# Patient Record
Sex: Female | Born: 1995 | Race: White | Hispanic: No | Marital: Single | Smoking: Never smoker
Health system: Southern US, Community
[De-identification: ages and names within clinical notes are randomized; demographics above are authoritative.]

## PROBLEM LIST (undated history)

## (undated) DIAGNOSIS — H579 Unspecified disorder of eye and adnexa: Secondary | ICD-10-CM

## (undated) DIAGNOSIS — M069 Rheumatoid arthritis, unspecified: Secondary | ICD-10-CM

## (undated) HISTORY — DX: Unspecified disorder of eye and adnexa: H57.9

## (undated) HISTORY — DX: Rheumatoid arthritis, unspecified: M06.9

---
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
## 2017-06-28 ENCOUNTER — Inpatient Hospital Stay
Admission: RE | Admit: 2017-06-28 | Discharge: 2017-06-28 | Disposition: A | Discharge status: Home / Self Care | Payer: BLUE CROSS/BLUE SHIELD | Source: Ambulatory Visit | Attending: Internal Medicine | Admitting: Internal Medicine

## 2019-01-27 ENCOUNTER — Other Ambulatory Visit: Payer: Self-pay

## 2019-01-27 DIAGNOSIS — Z20822 Contact with and (suspected) exposure to covid-19: Secondary | ICD-10-CM

## 2019-01-30 LAB — NOVEL CORONAVIRUS, NAA: SARS-CoV-2, NAA: NOT DETECTED

## 2019-07-27 ENCOUNTER — Encounter: Payer: Self-pay | Admitting: Osteopathic Medicine

## 2019-07-27 ENCOUNTER — Ambulatory Visit (INDEPENDENT_AMBULATORY_CARE_PROVIDER_SITE_OTHER): Payer: 59 | Admitting: Osteopathic Medicine

## 2019-07-27 VITALS — BP 128/84 | HR 106 | Temp 98.3°F | Ht 61.0 in | Wt 168.0 lb

## 2019-07-27 DIAGNOSIS — F900 Attention-deficit hyperactivity disorder, predominantly inattentive type: Secondary | ICD-10-CM

## 2019-07-27 DIAGNOSIS — Z0000 Encounter for general adult medical examination without abnormal findings: Secondary | ICD-10-CM | POA: Diagnosis not present

## 2019-07-27 DIAGNOSIS — F4323 Adjustment disorder with mixed anxiety and depressed mood: Secondary | ICD-10-CM | POA: Diagnosis not present

## 2019-07-27 DIAGNOSIS — M0800 Unspecified juvenile rheumatoid arthritis of unspecified site: Secondary | ICD-10-CM

## 2019-07-27 DIAGNOSIS — R635 Abnormal weight gain: Secondary | ICD-10-CM

## 2019-07-27 DIAGNOSIS — Z8349 Family history of other endocrine, nutritional and metabolic diseases: Secondary | ICD-10-CM | POA: Diagnosis not present

## 2019-07-27 MED ORDER — CITALOPRAM HYDROBROMIDE 10 MG PO TABS: 10.0000 mg | ORAL_TABLET | Freq: Every day | ORAL | 3 refills | Status: DC

## 2019-07-27 MED ORDER — LISDEXAMFETAMINE DIMESYLATE 20 MG PO CAPS: 20.0000 mg | ORAL_CAPSULE | Freq: Every day | ORAL | 0 refills | Status: DC

## 2019-07-27 MED ORDER — BUPROPION HCL ER (XL) 300 MG PO TB24: 300.0000 mg | ORAL_TABLET | Freq: Every day | ORAL | 3 refills | Status: DC

## 2019-07-27 MED ORDER — LISDEXAMFETAMINE DIMESYLATE 30 MG PO CAPS: 30.0000 mg | ORAL_CAPSULE | ORAL | 0 refills | Status: DC

## 2019-07-27 NOTE — Patient Instructions (Addendum)
Plan: Will increase Wellbutrin from 150 to 300 mg daily Will try to get back on the Vyvanse (posisble Vyvanse AM + immediate release stimulant in PM, will see!)  See below for other weight loss info - consider other Rx Labs today   General Preventive Care  Most recent routine screening labs: ordered today, or can defer to rheumatologist.   Everyone should have blood pressure checked once per year. Goal 130/80 or less.   Tobacco: don't! Alcohol: responsible moderation is ok for most adults - if you have concerns about your alcohol intake, please talk to me!   Exercise: as tolerated to reduce risk of cardiovascular disease and diabetes. Strength training will also prevent osteoporosis.   Mental health: if need for mental health care (medicines, counseling, other), or concerns about moods, please let me know!   Sexual health: if need for STD testing, or if concerns with libido/pain problems, or family planning options, please let me know!  Advanced Directive: Living Will and/or Healthcare Power of Attorney recommended for all adults, regardless of age or health.  Vaccines  Flu vaccine: for almost everyone, every fall.   Shingles vaccine: after age 69.   Pneumonia vaccines: after age 51  Tetanus booster: every 10 years / 3rd trimester of pregnancy - none on file 10+ years, will NEED booster if you suffer a deep cut/puncute/bite wound.   HPV vaccine: Gardasil up to age 62   COVID vaccine: discuss with your rheumatologist  Cancer screenings   Colon cancer screening: for everyone age 35-75. Colonoscopy available for all, many people also qualify for the Cologuard stool test   Breast cancer screening: mammogram at age 71 every other year at least, and annually after age 49.   Cervical cancer screening: Pap every 1 to 5 years depending on age and other risk factors. Can usually stop at age 16 or w/ hysterectomy.   Lung cancer screening: not needed for nonsmokers. Infection  screenings  . HIV: recommended screening at least once age 85-65, more often as needed. . Gonorrhea/Chlamydia: screening as needed . Hepatitis C: recommended once for everyone age 12-75 . TB: certain at-risk populations, or depending on work requirements and/or travel history Other . Bone Density Test: recommended for women at age 83      Weight loss: important things to remember  It is hard work! You will have setbacks, but don't get discouraged. The goal is not short-term success, it is long-term health.   Looking at the numbers is important to track your progress and set goals, but how you are feeling and your overall health are the most important things! BMI and pounds and calories and miles logged aren't everything - they are tools to help [place] reach your goals.  You can do this!!!   Things to remember for exercise for weight loss:   Please note - I am not a certified personal trainer. I can present you with ideas and general workout goals, but an exercise program is largely up to you. Find something you can stick with, and something you enjoy!   As you progress in your exercise regimen think about gradually increasing the following, week by week:   intensity (how strenuous is your workout)  frequency (how often you are exercising)  duration (how many minutes at a time you are exercising)  Walking for 20 minutes a day is certainly better than nothing, but more strenuous exercise will develop better cardiovascular fitness.   interval training (high-intensity alternating with low-intensity, think walk/jog  rather than just walk)  muscle strengthening exercises (weight lifting, calisthenics, yoga) - this also helps prevent osteoporosis!   Things to remember for diet changes for weight loss:   Please note - I am not a certified dietician. I can present you with ideas and general diet goals, but a meal plan is largely up to you. I am happy to refer you to a dietician who can give  you a detailed meal plan.  Apps/logs are helpful to track how you're eating! It's not realistic to be logging everything you eat forever, but when you're starting a healthy eating lifestyle it's very helpful, and checking in with logs now and then helps you stick to your program!   Calorie restriction with the goal weight loss of no more than one to one and a half pounds per week.   Increase lean protein such as chicken, fish, [place].   Decrease fatty foods such as dairy, butter.   Decrease sugary foods. Avoid sugary drinks such as soda or juice.  Increase fiber found in fruit and vegetables.   Medications approved for long-term use for obesity  Qsymia (Phentermine and Topiramate)  Saxenda (Liraglutide 3 mg/day)  Contrave (Bupropion and Naltrexone)  Orlistat (Xenical, Alli)  Bupropion (Wellbutrin) I recommend that you research the above medications and see which one(s) your insurance may or may not cover: If you call your insurance, ask them specifically what medications are on their formulary that are approved for obesity treatment. They should be able to send you a list or tell you over the phone. Remember, medications aren't magic! You MUST be diligent about lifestyle changes as well!

## 2019-07-27 NOTE — Progress Notes (Signed)
Kathryn Brady is a 24 y.o. female who presents to  [place] at [facility]  today, 07/27/19, seeking care for the following: . Reestablish care (>3 years since last visit)  . Weight gain 30+ lbs over past year  . ADHD and mental health - ok on current medications, Adderall tid does not work as well as Vyvanse 30 AM + 20 PM      Wt Readings from Last 3 Encounters:  07/27/19 168 lb 0.6 oz (76.2 kg)  01/10/16 113 lb (51.3 kg) (23 %, Z= -0.73)*  11/26/15 111 lb (50.3 kg) (20 %, Z= -0.85)*   * Growth percentiles are based on CDC (Girls, 2-20 Years) data.      ASSESSMENT & PLAN with other pertinent history/findings:  The primary encounter diagnosis was Annual physical exam. 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     Patient Instructions  Plan: Will increase Wellbutrin from 150 to 300 mg daily Will try to get back on the Vyvanse (posisble Vyvanse AM + immediate release stimulant in PM, will see!)  See below for other weight loss info - consider other Rx Labs today   General Preventive Care  Most recent routine screening labs: ordered today, or can defer to rheumatologist.   Everyone should have blood pressure checked once per year. Goal 130/80 or less.   Tobacco: don't! Alcohol: responsible moderation is ok for most adults - if you have concerns about your alcohol intake, please talk to me!   Exercise: as tolerated to reduce risk of cardiovascular disease and diabetes. Strength training will also prevent osteoporosis.   Mental health: if need for mental health care (medicines, counseling, other), or concerns about moods, please let me know!   Sexual health: if need for STD testing, or  if concerns with libido/pain problems, or family planning options, please let me know!  Advanced Directive: Living Will and/or Healthcare Power of Attorney recommended for all adults, regardless of age or health.  Vaccines  Flu vaccine: for almost everyone, every fall.   Shingles vaccine: after age 57.   Pneumonia vaccines: after age 34  Tetanus booster: every 10 years / 3rd trimester of pregnancy - none on file 10+ years, will NEED booster if you suffer a deep cut/puncute/bite wound.   HPV vaccine: Gardasil up to age 52   COVID vaccine: discuss with your rheumatologist  Cancer screenings   Colon cancer screening: for everyone age 20-75. Colonoscopy available for all, many people also qualify for the Cologuard stool test   Breast cancer screening: mammogram at age 47 every other year at least, and annually after age 38.   Cervical cancer screening: Pap every 1 to 5 years depending on age and other risk factors. Can usually stop at age 23 or w/ hysterectomy.   Lung cancer screening: not needed for nonsmokers. Infection screenings  . HIV: recommended screening at least once age 65-65, more often as needed. . Gonorrhea/Chlamydia: screening as needed . Hepatitis C: recommended once for everyone age 24-75 . TB: certain at-risk populations, or depending on work requirements and/or travel history Other . Bone Density Test: recommended for women at age 9      Weight loss: important things to remember  It is hard work! You will have setbacks, but don't get discouraged. The goal  is not short-term success, it is long-term health.   Looking at the numbers is important to track your progress and set goals, but how you are feeling and your overall health are the most important things! BMI and pounds and calories and miles logged aren't everything - they are tools to help [place] reach your goals.  You can do this!!!   Things to remember for exercise for weight loss:   Please note - I am  not a certified personal trainer. I can present you with ideas and general workout goals, but an exercise program is largely up to you. Find something you can stick with, and something you enjoy!   As you progress in your exercise regimen think about gradually increasing the following, week by week:   intensity (how strenuous is your workout)  frequency (how often you are exercising)  duration (how many minutes at a time you are exercising)  Walking for 20 minutes a day is certainly better than nothing, but more strenuous exercise will develop better cardiovascular fitness.   interval training (high-intensity alternating with low-intensity, think walk/jog rather than just walk)  muscle strengthening exercises (weight lifting, calisthenics, yoga) - this also helps prevent osteoporosis!   Things to remember for diet changes for weight loss:   Please note - I am not a certified dietician. I can present you with ideas and general diet goals, but a meal plan is largely up to you. I am happy to refer you to a dietician who can give you a detailed meal plan.  Apps/logs are helpful to track how you're eating! It's not realistic to be logging everything you eat forever, but when you're starting a healthy eating lifestyle it's very helpful, and checking in with logs now and then helps you stick to your program!   Calorie restriction with the goal weight loss of no more than one to one and a half pounds per week.   Increase lean protein such as chicken, fish, [place].   Decrease fatty foods such as dairy, butter.   Decrease sugary foods. Avoid sugary drinks such as soda or juice.  Increase fiber found in fruit and vegetables.   Medications approved for long-term use for obesity  Qsymia (Phentermine and Topiramate)  Saxenda (Liraglutide 3 mg/day)  Contrave (Bupropion and Naltrexone)  Orlistat (Xenical, Alli)  Bupropion (Wellbutrin) I recommend that you research the above medications and  see which one(s) your insurance may or may not cover: If you call your insurance, ask them specifically what medications are on their formulary that are approved for obesity treatment. They should be able to send you a list or tell you over the phone. Remember, medications aren't magic! You MUST be diligent about lifestyle changes as well!       Orders Placed This Encounter  Procedures  . CBC  . COMPLETE METABOLIC PANEL WITH GFR  . Lipid panel  . TSH  . Hemoglobin A1c    Meds ordered this encounter  Medications  . buPROPion (WELLBUTRIN XL) 300 MG 24 hr tablet    Sig: Take 1 tablet (300 mg total) by mouth daily.    Dispense:  90 tablet    Refill:  3  . citalopram (CELEXA) 10 MG tablet    Sig: Take 1 tablet (10 mg total) by mouth daily.    Dispense:  90 tablet    Refill:  3  . lisdexamfetamine (VYVANSE) 20 MG capsule    Sig: Take 1 capsule (20 mg total) by mouth  daily after lunch.    Dispense:  30 capsule    Refill:  0  . lisdexamfetamine (VYVANSE) 30 MG capsule    Sig: Take 1 capsule (30 mg total) by mouth every morning.    Dispense:  30 capsule    Refill:  0           Follow-up instructions: Return in about 3 months (around 10/27/2019) for follow up on weigh tand ADD .                                         BP 128/84 (BP Location: Left Arm, Patient Position: Sitting, Cuff Size: Normal)   Pulse (!) 106   Temp 98.3 F (36.8 C) (Oral)   Ht 5\' 1"  (1.549 m)   Wt 168 lb 0.6 oz (76.2 kg)   BMI 31.75 kg/m   Current Meds  Medication Sig  . amphetamine-dextroamphetamine (ADDERALL) 12.5 MG tablet   . citalopram (CELEXA) 10 MG tablet Take 1 tablet (10 mg total) by mouth daily.  . DUREZOL 0.05 % EMUL   . [DISCONTINUED] buPROPion (WELLBUTRIN XL) 150 MG 24 hr tablet Take 150 mg by mouth every morning.  . [DISCONTINUED] citalopram (CELEXA) 10 MG tablet Take 1 tablet (10 mg total) by mouth daily.    No results found for this or any  previous visit (from the past 72 hour(s)).  No results found.  Depression screen [facility] 2/9 07/27/2019  Decreased Interest 0  Down, Depressed, Hopeless 1  PHQ - 2 Score 1  Altered sleeping 2  Tired, decreased energy 0  Change in appetite 0  Feeling bad or failure about yourself  0  Trouble concentrating 1  Moving slowly or fidgety/restless 0  Suicidal thoughts 0  PHQ-9 Score 4  Difficult doing work/chores Not difficult at all  Some encounter information is confidential and restricted. Go to Review Flowsheets activity to see all data.    GAD 7 : Generalized Anxiety Score 07/27/2019 10/22/2015  Nervous, Anxious, on Edge 1 3  Control/stop worrying 0 3  Worry too much - different things 1 3  Trouble relaxing 1 2  Restless 0 1  Easily annoyed or irritable 1 2  Afraid - awful might happen 1 3  Total GAD 7 Score 5 17  Anxiety Difficulty Not difficult at all -      All questions at time of visit were answered - patient instructed to contact office with any additional concerns or updates.  ER/RTC precautions were reviewed with the patient.  Please note: voice recognition software was used to produce this document, and typos may escape review. Please contact Dr. Sheppard Coil for any needed clarifications.

## 2019-08-23 ENCOUNTER — Encounter: Payer: Self-pay | Admitting: Family Medicine

## 2019-08-23 ENCOUNTER — Other Ambulatory Visit: Payer: Self-pay

## 2019-08-23 ENCOUNTER — Telehealth (INDEPENDENT_AMBULATORY_CARE_PROVIDER_SITE_OTHER): Payer: 59 | Admitting: Family Medicine

## 2019-08-23 DIAGNOSIS — J069 Acute upper respiratory infection, unspecified: Secondary | ICD-10-CM | POA: Diagnosis not present

## 2019-08-23 NOTE — Progress Notes (Signed)
Kathryn Brady - 24 y.o. female MRN [id number]  Date of birth: 03-Sep-1995   This visit type was conducted due to national recommendations for restrictions regarding the COVID-19 Pandemic (e.g. social distancing).  This format is felt to be most appropriate for this patient at this time.  All issues noted in this document were discussed and addressed.  No physical exam was performed (except for noted visual exam findings with Video Visits).  I discussed the limitations of evaluation and management by telemedicine and the availability of in person appointments. The patient expressed understanding and agreed to proceed.  I connected with@ on 08/23/19 at  2:00 PM EDT by a video enabled telemedicine application and verified that I am speaking with the correct person using two identifiers.  Present at visit: Kathryn Nutting, DO Kathryn Brady   Patient Location: [place] [address]   Provider location:   [place]  No chief complaint on file.   HPI  Kathryn Brady is a 24 y.o. female who presents via Engineer, civil (consulting) for a telehealth visit today.  Reports having recent illness with low grade fever, cough, congestion.  Reports that fever broke 2 days ago but has been out of work due to symptoms from 08/18/19 until today.  She is planning on returning to work tomorrow.  She still has some residual congestion but overall is feeling much better.    ROS:  A comprehensive ROS was completed and negative except as noted per HPI  Past Medical History:  Diagnosis Date  . Eye problem   . Rheumatoid arthritis [facility]) age 57    No past surgical history on file.  Family History  Problem Relation Age of Onset  . Depression Father   . Alcohol abuse Father   . Anxiety disorder Father   . Drug abuse Father   . Hyperlipidemia Father   . Hypertension Father   . Hypothyroidism Mother   . Stroke Maternal Uncle   . Cancer Maternal Grandmother   . Diabetes Maternal  Grandmother   . Heart attack Maternal Grandfather   . Heart attack Paternal Grandmother   . Heart attack Paternal Grandfather     Social History   Socioeconomic History  . Marital status: Single    Spouse name: Not on file  . Number of children: Not on file  . Years of education: Not on file  . Highest education level: Not on file  Occupational History  . Not on file  Tobacco Use  . Smoking status: Never Smoker  . Smokeless tobacco: Never Used  Vaping Use  . Vaping Use: Never used  Substance and Sexual Activity  . Alcohol use: No  . Drug use: No  . Sexual activity: Yes    Partners: Male  Other Topics Concern  . Not on file  Social History Narrative  . Not on file   Social Determinants of Health   Financial Resource Strain:   . Difficulty of Paying Living Expenses:   Food Insecurity:   . Worried About Charity fundraiser in the Last Year:   . Arboriculturist in the Last Year:   Transportation Needs:   . Film/video editor (Medical):   Marland Kitchen Lack of Transportation (Non-Medical):   Physical Activity:   . Days of Exercise per Week:   . Minutes of Exercise per Session:   Stress:   . Feeling of Stress :   Social Connections:   . Frequency of Communication  with Friends and Family:   . Frequency of Social Gatherings with Friends and Family:   . Attends Religious Services:   . Active Member of Clubs or Organizations:   . Attends Archivist Meetings:   Marland Kitchen Marital Status:   Intimate Partner Violence:   . Fear of Current or Ex-Partner:   . Emotionally Abused:   Marland Kitchen Physically Abused:   . Sexually Abused:      Current Outpatient Medications:  .  amphetamine-dextroamphetamine (ADDERALL) 12.5 MG tablet, , Disp: , Rfl:  .  buPROPion (WELLBUTRIN XL) 300 MG 24 hr tablet, Take 1 tablet (300 mg total) by mouth daily., Disp: 90 tablet, Rfl: 3 .  citalopram (CELEXA) 10 MG tablet, Take 1 tablet (10 mg total) by mouth daily., Disp: 90 tablet, Rfl: 3 .  DUREZOL 0.05 %  EMUL, , Disp: , Rfl: 0 .  lisdexamfetamine (VYVANSE) 20 MG capsule, Take 1 capsule (20 mg total) by mouth daily after lunch., Disp: 30 capsule, Rfl: 0 .  lisdexamfetamine (VYVANSE) 30 MG capsule, Take 1 capsule (30 mg total) by mouth every morning., Disp: 30 capsule, Rfl: 0  EXAM:  VITALS per patient if applicable: Wt 113 lb (51.3 kg)   BMI 21.35 kg/m   GENERAL: alert, oriented, appears well and in no acute distress  HEENT: atraumatic, conjunttiva clear, no obvious abnormalities on inspection of external nose and ears  NECK: normal movements of the head and neck  LUNGS: on inspection no signs of respiratory distress, breathing rate appears normal, no obvious gross SOB, gasping or wheezing  CV: no obvious cyanosis  MS: moves all visible extremities without noticeable abnormality  PSYCH/NEURO: pleasant and cooperative, no obvious depression or anxiety, speech and thought processing grossly intact  ASSESSMENT AND PLAN:  Discussed the following assessment and plan:  URI (upper respiratory infection) Symptoms nearly resolved at this point.  She will continue supportive care and f/u as needed.  Note sent through mychart for missed time from work.   20 minutes spent including pre visit preparation, review of prior notes and labs, encounter with patient via video visit and same day documentation.    I discussed the assessment and treatment plan with the patient. The patient was provided an opportunity to ask questions and all were answered. The patient agreed with the plan and demonstrated an understanding of the instructions.   The patient was advised to call back or seek an in-person evaluation if the symptoms worsen or if the condition fails to improve as anticipated.    Kathryn Nutting, DO

## 2019-08-23 NOTE — Assessment & Plan Note (Addendum)
Symptoms nearly resolved at this point.  She will continue supportive care and f/u as needed.  Note sent through mychart for missed time from work.

## 2019-08-23 NOTE — Progress Notes (Signed)
Symptoms: nasal drip, cough Started x 1 week prior  Took Allegra and Benadryl. Added Tylenol after fever 100.0 started. Fever broke 2 days prior.

## 2019-08-30 ENCOUNTER — Telehealth: Payer: Self-pay | Admitting: Neurology

## 2019-08-30 NOTE — Telephone Encounter (Signed)
Patient left vm on my line stating she wanted to go back to Adderall instead of Vyvanse. Please follow up with her about medication change requests.

## 2019-09-01 NOTE — Telephone Encounter (Signed)
She will need to discuss this change with Dr. Sheppard Coil. Please contact her to get her scheduled for a visit next week to talk about this.

## 2019-09-01 NOTE — Telephone Encounter (Signed)
Forwarding to covering provider.

## 2019-09-04 NOTE — Telephone Encounter (Signed)
Attempted to contact pt regarding covering provider's note. No answer, unable to leave a vm msg for pt. Vm box is full.

## 2019-09-07 NOTE — Telephone Encounter (Signed)
Task completed. Pt has been updated of covering provider's note. Agreeable with plan. Pt has been scheduled on 09/13/19 with Dr. Sheppard Coil to discuss changing her medication.

## 2019-09-13 ENCOUNTER — Other Ambulatory Visit: Payer: Self-pay

## 2019-09-13 ENCOUNTER — Ambulatory Visit (INDEPENDENT_AMBULATORY_CARE_PROVIDER_SITE_OTHER): Payer: 59 | Admitting: Osteopathic Medicine

## 2019-09-13 ENCOUNTER — Encounter: Payer: Self-pay | Admitting: Osteopathic Medicine

## 2019-09-13 VITALS — BP 113/64 | HR 78 | Wt 157.0 lb

## 2019-09-13 DIAGNOSIS — H9191 Unspecified hearing loss, right ear: Secondary | ICD-10-CM | POA: Diagnosis not present

## 2019-09-13 DIAGNOSIS — F900 Attention-deficit hyperactivity disorder, predominantly inattentive type: Secondary | ICD-10-CM

## 2019-09-13 MED ORDER — AMPHETAMINE-DEXTROAMPHETAMINE 12.5 MG PO TABS: 12.5000 mg | ORAL_TABLET | Freq: Three times a day (TID) | ORAL | 0 refills | Status: DC

## 2019-09-13 NOTE — Progress Notes (Signed)
Kathryn Brady is a 24 y.o. female who presents to  [place] at [facility]  today, 09/13/19, seeking care for the following:  . Change ADD Rx, currently on Vyvanse 30 mg AM + 20 mg PM. Would like to get on Adderall. Previously on Adderall 12.5 tid which was not as helpful as Vyvanse 30 mg AM + 20 mg PM. Vyvanse cost now an issue, she also feels it's not working as well, might help if more time to get used to it but uable to afford the cost anyway  . C/o hearing loss on R ear ongoing for awhile, no headaches, tinnitus, vision change or vertigo.      ASSESSMENT & PLAN with other pertinent findings:  The primary encounter diagnosis was Attention deficit hyperactivity disorder (ADHD), predominantly inattentive type. A diagnosis of Hearing reduced, right was also pertinent to this visit.    There are no Patient Instructions on file for this visit.  Orders Placed This Encounter  Procedures  . Ambulatory referral to Audiology    Meds ordered this encounter  Medications  . amphetamine-dextroamphetamine (ADDERALL) 12.5 MG tablet    Sig: Take 1 tablet by mouth 3 (three) times daily.    Dispense:  90 tablet    Refill:  0       Follow-up instructions: Return in about 3 months (around 12/14/2019) for MONITOR ON ADD MEDS, SEE ME SOONER IF NEEDED .                                         BP 113/64 (BP Location: Right Arm, Patient Position: Sitting)   Pulse 78   Wt 157 lb (71.2 kg)   SpO2 97%   BMI 29.66 kg/m   Current Meds  Medication Sig  . buPROPion (WELLBUTRIN XL) 300 MG 24 hr tablet Take 1 tablet (300 mg total) by mouth daily.  . citalopram (CELEXA) 10 MG tablet Take 1 tablet (10 mg total) by mouth daily.  . DUREZOL 0.05 % EMUL     No results found for this or any previous visit (from the past 13 hour(s)).  No results found.     All questions at time of visit were answered - patient  instructed to contact office with any additional concerns or updates.  ER/RTC precautions were reviewed with the patient as applicable.   Please note: voice recognition software was used to produce this document, and typos may escape review. Please contact Dr. Sheppard Coil for any needed clarifications.   Total encounter time: 20 minutes.

## 2019-09-20 ENCOUNTER — Ambulatory Visit: Payer: 59 | Admitting: Audiologist

## 2019-10-12 ENCOUNTER — Other Ambulatory Visit: Payer: Self-pay

## 2019-10-12 NOTE — Telephone Encounter (Signed)
Last visit in July.

## 2019-10-13 MED ORDER — AMPHETAMINE-DEXTROAMPHETAMINE 12.5 MG PO TABS: 12.5000 mg | ORAL_TABLET | Freq: Three times a day (TID) | ORAL | 0 refills | Status: DC

## 2019-10-13 NOTE — Telephone Encounter (Signed)
Unable to leave a message, voicemail is full.  

## 2019-10-27 ENCOUNTER — Ambulatory Visit: Payer: 59 | Admitting: Osteopathic Medicine

## 2019-11-10 ENCOUNTER — Other Ambulatory Visit: Payer: Self-pay | Admitting: Osteopathic Medicine

## 2019-11-10 MED ORDER — AMPHETAMINE-DEXTROAMPHETAMINE 12.5 MG PO TABS: 12.5000 mg | ORAL_TABLET | Freq: Three times a day (TID) | ORAL | 0 refills | Status: DC

## 2019-11-10 NOTE — Telephone Encounter (Signed)
Pt called today needing refill on adderall. Her last appt was on 07/07. We did schedule her for a 3 month f/u on 10/06. She states that she has one day left. [facility] is the correct pharmacy.

## 2019-11-10 NOTE — Telephone Encounter (Signed)
Medication refill request Last refill- 10/13/2019 Last ov-09/13/2019

## 2019-11-15 NOTE — Telephone Encounter (Signed)
Sent yesterday

## 2019-12-11 ENCOUNTER — Encounter: Payer: Self-pay | Admitting: Medical-Surgical

## 2019-12-11 ENCOUNTER — Ambulatory Visit: Payer: 59

## 2019-12-11 ENCOUNTER — Telehealth (INDEPENDENT_AMBULATORY_CARE_PROVIDER_SITE_OTHER): Payer: 59 | Admitting: Medical-Surgical

## 2019-12-11 VITALS — BP 121/86 | HR 73 | Temp 99.0°F | Wt 150.0 lb

## 2019-12-11 DIAGNOSIS — R509 Fever, unspecified: Secondary | ICD-10-CM

## 2019-12-11 DIAGNOSIS — J069 Acute upper respiratory infection, unspecified: Secondary | ICD-10-CM | POA: Diagnosis not present

## 2019-12-11 DIAGNOSIS — R110 Nausea: Secondary | ICD-10-CM | POA: Diagnosis not present

## 2019-12-11 DIAGNOSIS — R197 Diarrhea, unspecified: Secondary | ICD-10-CM

## 2019-12-11 DIAGNOSIS — R0600 Dyspnea, unspecified: Secondary | ICD-10-CM

## 2019-12-11 NOTE — Progress Notes (Signed)
Virtual Visit via Telephone   I connected with  Kathryn Brady  on 12/11/19 by telephone/telehealth and verified that I am speaking with the correct person using two identifiers.   I discussed the limitations, risks, security and privacy concerns of performing an evaluation and management service by telephone, including the higher likelihood of inaccurate diagnosis and treatment, and the availability of in person appointments.  We also discussed the likely need of an additional face to face encounter for complete and high quality delivery of care.  I also discussed with the patient that there may be a patient responsible charge related to this service. The patient expressed understanding and wishes to proceed.  Provider location is in medical facility. Patient location is at their home, different from provider location. People involved in care of the patient during this telehealth encounter were myself, my nurse/medical assistant, and my front office/scheduling team member.  CC: fever over the weekend, needs work note  HPI: Pleasant 24 year old female presenting via telephone for a work note for her absences over the weekend. She noticed that her allergies were bad on Friday with rhinorrhea, sore eyes, and dyspnea. She also had a fever of 100 on Saturday and Sunday. Has a temperature of 99 today. Also having nausea with intermittent diarrhea but no vomiting. Denies coughing and vomiting. 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 Has not had COVID vaccines. Has not been COVID tested.   Review of Systems: See HPI for pertinent positives and negatives.   Objective Findings:    General: Speaking full sentences, no audible heavy breathing.  Sounds alert and appropriately interactive.    Impression and Recommendations:    1. URI, unspecified Recommend COVID testing. Discussed testing locations. Added to the nurse visit schedule for 4pm. Patient provided with the number to call on  her arrival. Work note sent via [place]. Continue symptomatic treatment at home.   I discussed the above assessment and treatment plan with the patient. The patient was provided an opportunity to ask questions and all were answered. The patient agreed with the plan and demonstrated an understanding of the instructions.   The patient was advised to call back or seek an in-person evaluation if the symptoms worsen or if the condition fails to improve as anticipated.  15 minutes of non-face-to-face time was provided during this encounter.  Return if symptoms worsen or fail to improve. ___________________________________________ Samuel Bouche, DNP, APRN, FNP-BC [facility] and [place]

## 2019-12-13 ENCOUNTER — Encounter: Payer: Self-pay | Admitting: Osteopathic Medicine

## 2019-12-13 ENCOUNTER — Ambulatory Visit (INDEPENDENT_AMBULATORY_CARE_PROVIDER_SITE_OTHER): Payer: 59 | Admitting: Osteopathic Medicine

## 2019-12-13 VITALS — BP 126/69 | HR 97 | Wt 155.0 lb

## 2019-12-13 DIAGNOSIS — F900 Attention-deficit hyperactivity disorder, predominantly inattentive type: Secondary | ICD-10-CM | POA: Diagnosis not present

## 2019-12-13 DIAGNOSIS — F4323 Adjustment disorder with mixed anxiety and depressed mood: Secondary | ICD-10-CM | POA: Diagnosis not present

## 2019-12-13 LAB — SARS-COV-2, NAA 2 DAY TAT

## 2019-12-13 LAB — NOVEL CORONAVIRUS, NAA: SARS-CoV-2, NAA: NOT DETECTED

## 2019-12-13 MED ORDER — AMPHETAMINE-DEXTROAMPHETAMINE 12.5 MG PO TABS: 12.5000 mg | ORAL_TABLET | Freq: Three times a day (TID) | ORAL | 0 refills | Status: DC

## 2019-12-13 MED ORDER — CITALOPRAM HYDROBROMIDE 40 MG PO TABS
40.0000 mg | ORAL_TABLET | Freq: Every day | ORAL | 3 refills | Status: DC
Start: 2019-12-13 — End: 2020-10-16

## 2019-12-13 NOTE — Progress Notes (Signed)
Kathryn Brady is a 24 y.o. female who presents to  [place] at [facility]  today, 12/13/19, seeking care for the following:   ADD Rx refill      ASSESSMENT & PLAN with other pertinent findings:  The primary encounter diagnosis was Attention deficit hyperactivity disorder (ADHD), predominantly inattentive type. A diagnosis of Adjustment disorder with mixed anxiety and depressed mood was also pertinent to this visit.   Stable on current medications  Requests correction of Celexa dose, has been on 40 mg   Meds ordered this encounter  Medications   amphetamine-dextroamphetamine (ADDERALL) 12.5 MG tablet    Sig: Take 1 tablet by mouth 3 (three) times daily.    Dispense:  270 tablet    Refill:  0   citalopram (CELEXA) 40 MG tablet    Sig: Take 1 tablet (40 mg total) by mouth daily.    Dispense:  90 tablet    Refill:  3    CANCEL 10 MG DOSE THANKS       Follow-up instructions: Return in about 6 months (around 06/12/2020) for ANNUAL (call week prior to visit for lab orders).                                         BP 126/69 (BP Location: Right Arm, Patient Position: Sitting)    Pulse 97    Wt 155 lb (70.3 kg)    SpO2 100%    BMI 29.29 kg/m   No outpatient medications have been marked as taking for the 12/13/19 encounter (Office Visit) with Emeterio Reeve, DO.    No results found for this or any previous visit (from the past 72 hour(s)).  No results found.     All questions at time of visit were answered - patient instructed to contact office with any additional concerns or updates.  ER/RTC precautions were reviewed with the patient as applicable.   Please note: voice recognition software was used to produce this document, and typos may escape review. Please contact Dr. Sheppard Coil for any needed clarifications.   Total encounter time: 20 minutes.

## 2019-12-26 ENCOUNTER — Encounter: Payer: Self-pay | Admitting: Osteopathic Medicine

## 2019-12-26 ENCOUNTER — Other Ambulatory Visit: Payer: Self-pay

## 2019-12-26 ENCOUNTER — Ambulatory Visit: Payer: 59 | Admitting: Osteopathic Medicine

## 2019-12-26 VITALS — BP 124/79 | HR 94 | Temp 98.1°F | Wt 151.1 lb

## 2019-12-26 DIAGNOSIS — G5602 Carpal tunnel syndrome, left upper limb: Secondary | ICD-10-CM

## 2019-12-26 MED ORDER — PREDNISONE 20 MG PO TABS: 20.0000 mg | ORAL_TABLET | Freq: Two times a day (BID) | ORAL | 0 refills | Status: DC

## 2019-12-26 NOTE — Patient Instructions (Signed)
PLAN:  BRACE all day and night for next 1-2 weeks, then at night only  NAPROXEN 1-2 times per day for 1 week PREDNISONE (STEROIDS) RX if no better after 203 days of brace + naproxen  If no better in 2-3 weeks, or sooner if worse/change, please schedule appointment with Dr T        Carpal Tunnel Syndrome  Carpal tunnel syndrome is a condition that causes pain in your hand and arm. The carpal tunnel is a narrow area located on the palm side of your wrist. Repeated wrist motion or certain diseases may cause swelling within the tunnel. This swelling pinches the main nerve in the wrist (median nerve). What are the causes? This condition may be caused by:  Repeated wrist motions.  Wrist injuries.  Arthritis.  A cyst or tumor in the carpal tunnel.  Fluid buildup during pregnancy. Sometimes the cause of this condition is not known. What increases the risk? The following factors may make you more likely to develop this condition:  Having a job, such as being a Research scientist (life sciences), that requires you to repeatedly move your wrist in the same motion.  Being a woman.  Having certain conditions, such as: ? Diabetes. ? Obesity. ? An underactive thyroid (hypothyroidism). ? Kidney failure. What are the signs or symptoms? Symptoms of this condition include:  A tingling feeling in your fingers, especially in your thumb, index, and middle fingers.  Tingling or numbness in your hand.  An aching feeling in your entire arm, especially when your wrist and elbow are bent for a long time.  Wrist pain that goes up your arm to your shoulder.  Pain that goes down into your palm or fingers.  A weak feeling in your hands. You may have trouble grabbing and holding items. Your symptoms may feel worse during the night. How is this diagnosed? This condition is diagnosed with a medical history and physical exam. You may also have tests, including:  Electromyogram (EMG). This test measures  electrical signals sent by your nerves into the muscles.  Nerve conduction study. This test measures how well electrical signals pass through your nerves.  Imaging tests, such as X-rays, ultrasound, and MRI. These tests check for possible causes of your condition. How is this treated? This condition may be treated with:  Lifestyle changes. It is important to stop or change the activity that caused your condition.  Doing exercise and activities to strengthen your muscles and bones (physical therapy).  Learning how to use your hand again after diagnosis (occupational therapy).  Medicines for pain and inflammation. This may include medicine that is injected into your wrist.  A wrist splint.  Surgery. Follow these instructions at home: If you have a splint:  Wear the splint as told by your health care provider. Remove it only as told by your health care provider.  Loosen the splint if your fingers tingle, become numb, or turn cold and blue.  Keep the splint clean.  If the splint is not waterproof: ? Do not let it get wet. ? Cover it with a watertight covering when you take a bath or shower. Managing pain, stiffness, and swelling   If directed, put ice on the painful area: ? If you have a removable splint, remove it as told by your health care provider. ? Put ice in a plastic bag. ? Place a towel between your skin and the bag. ? Leave the ice on for 20 minutes, 2-3 times per day.  General instructions  Take over-the-counter and prescription medicines only as told by your health care provider.  Rest your wrist from any activity that may be causing your pain. If your condition is work related, talk with your employer about changes that can be made, such as getting a wrist pad to use while typing.  Do any exercises as told by your health care provider, physical therapist, or occupational therapist.  Keep all follow-up visits as told by your health care provider. This is  important. Contact a health care provider if:  You have new symptoms.  Your pain is not controlled with medicines.  Your symptoms get worse. Get help right away if:  You have severe numbness or tingling in your wrist or hand. Summary  Carpal tunnel syndrome is a condition that causes pain in your hand and arm.  It is usually caused by repeated wrist motions.  Lifestyle changes and medicines are used to treat carpal tunnel syndrome. Surgery may be recommended.  Follow your health care provider's instructions about wearing a splint, resting from activity, keeping follow-up visits, and calling for help. This information is not intended to replace advice given to you by your health care provider. Make sure you discuss any questions you have with your health care provider. Document Revised: 07/02/2017 Document Reviewed: 07/02/2017 Elsevier Patient Education  [place].

## 2019-12-26 NOTE — Progress Notes (Signed)
Kathryn Brady is a 24 y.o. female who presents to  [place] at [facility]  today, 12/26/19, seeking care for the following:  . L hand, 2nd/3rd/4th digits and sometimes 1st digit will feel numb. This particular episode has been ongoing since 5 days ago but has noticed previous shorter episodes that have resolved on their own without incident.Marland Kitchen Positive Tinel's and Phalen's sign on the left     ASSESSMENT & PLAN with other pertinent findings:  The encounter diagnosis was Carpal tunnel syndrome, left.     Patient Instructions  PLAN:  BRACE all day and night for next 1-2 weeks, then at night only  NAPROXEN 1-2 times per day for 1 week PREDNISONE (STEROIDS) RX if no better after 203 days of brace + naproxen  If no better in 2-3 weeks, or sooner if worse/change, please schedule appointment with Dr T        Carpal Tunnel Syndrome  Carpal tunnel syndrome is a condition that causes pain in your hand and arm. The carpal tunnel is a narrow area located on the palm side of your wrist. Repeated wrist motion or certain diseases may cause swelling within the tunnel. This swelling pinches the main nerve in the wrist (median nerve). What are the causes? This condition may be caused by:  Repeated wrist motions.  Wrist injuries.  Arthritis.  A cyst or tumor in the carpal tunnel.  Fluid buildup during pregnancy. Sometimes the cause of this condition is not known. What increases the risk? The following factors may make you more likely to develop this condition:  Having a job, such as being a Research scientist (life sciences), that requires you to repeatedly move your wrist in the same motion.  Being a woman.  Having certain conditions, such as: ? Diabetes. ? Obesity. ? An underactive thyroid (hypothyroidism). ? Kidney failure. What are the signs or symptoms? Symptoms of this condition include:  A tingling feeling in your fingers,  especially in your thumb, index, and middle fingers.  Tingling or numbness in your hand.  An aching feeling in your entire arm, especially when your wrist and elbow are bent for a long time.  Wrist pain that goes up your arm to your shoulder.  Pain that goes down into your palm or fingers.  A weak feeling in your hands. You may have trouble grabbing and holding items. Your symptoms may feel worse during the night. How is this diagnosed? This condition is diagnosed with a medical history and physical exam. You may also have tests, including:  Electromyogram (EMG). This test measures electrical signals sent by your nerves into the muscles.  Nerve conduction study. This test measures how well electrical signals pass through your nerves.  Imaging tests, such as X-rays, ultrasound, and MRI. These tests check for possible causes of your condition. How is this treated? This condition may be treated with:  Lifestyle changes. It is important to stop or change the activity that caused your condition.  Doing exercise and activities to strengthen your muscles and bones (physical therapy).  Learning how to use your hand again after diagnosis (occupational therapy).  Medicines for pain and inflammation. This may include medicine that is injected into your wrist.  A wrist splint.  Surgery. Follow these instructions at home: If you have a splint:  Wear the splint as told by your health care provider. Remove it only as told by your health care provider.  Loosen the splint if your  fingers tingle, become numb, or turn cold and blue.  Keep the splint clean.  If the splint is not waterproof: ? Do not let it get wet. ? Cover it with a watertight covering when you take a bath or shower. Managing pain, stiffness, and swelling   If directed, put ice on the painful area: ? If you have a removable splint, remove it as told by your health care provider. ? Put ice in a plastic bag. ? Place a  towel between your skin and the bag. ? Leave the ice on for 20 minutes, 2-3 times per day. General instructions  Take over-the-counter and prescription medicines only as told by your health care provider.  Rest your wrist from any activity that may be causing your pain. If your condition is work related, talk with your employer about changes that can be made, such as getting a wrist pad to use while typing.  Do any exercises as told by your health care provider, physical therapist, or occupational therapist.  Keep all follow-up visits as told by your health care provider. This is important. Contact a health care provider if:  You have new symptoms.  Your pain is not controlled with medicines.  Your symptoms get worse. Get help right away if:  You have severe numbness or tingling in your wrist or hand. Summary  Carpal tunnel syndrome is a condition that causes pain in your hand and arm.  It is usually caused by repeated wrist motions.  Lifestyle changes and medicines are used to treat carpal tunnel syndrome. Surgery may be recommended.  Follow your health care provider's instructions about wearing a splint, resting from activity, keeping follow-up visits, and calling for help. This information is not intended to replace advice given to you by your health care provider. Make sure you discuss any questions you have with your health care provider. Document Revised: 07/02/2017 Document Reviewed: 07/02/2017 Elsevier Patient Education  [place].    No orders of the defined types were placed in this encounter.   Meds ordered this encounter  Medications  . predniSONE (DELTASONE) 20 MG tablet    Sig: Take 1 tablet (20 mg total) by mouth 2 (two) times daily with a meal.    Dispense:  10 tablet    Refill:  0       Follow-up instructions: Return for VISIT WITH [facility]ORTS [facility]ICINE FOR ORTHOPEDIC ISSUE IF NO IMPROVEMENT / IF  WORSE.                                         BP 124/79 (BP Location: Right Arm, Patient Position: Sitting, Cuff Size: Normal)   Pulse 94   Temp 98.1 F (36.7 C) (Oral)   Wt 151 lb 1.9 oz (68.5 kg)   BMI 28.55 kg/m   Current Meds  Medication Sig  . amphetamine-dextroamphetamine (ADDERALL) 12.5 MG tablet Take 1 tablet by mouth 3 (three) times daily.  Marland Kitchen buPROPion (WELLBUTRIN XL) 300 MG 24 hr tablet Take 1 tablet (300 mg total) by mouth daily.  . citalopram (CELEXA) 40 MG tablet Take 1 tablet (40 mg total) by mouth daily.  . DUREZOL 0.05 % EMUL   . fexofenadine (ALLEGRA) 180 MG tablet Take 180 mg by mouth daily.  . naproxen (NAPROSYN) 375 MG tablet Take 375 mg by mouth 2 (two) times daily with a meal.    No results found  for this or any previous visit (from the past 56 hour(s)).  No results found.     All questions at time of visit were answered - patient instructed to contact office with any additional concerns or updates.  ER/RTC precautions were reviewed with the patient as applicable.   Please note: voice recognition software was used to produce this document, and typos may escape review. Please contact Dr. Sheppard Coil for any needed clarifications.

## 2020-03-15 ENCOUNTER — Telehealth: Payer: Self-pay | Admitting: Osteopathic Medicine

## 2020-03-15 MED ORDER — AMPHETAMINE-DEXTROAMPHETAMINE 12.5 MG PO TABS: 12.5000 mg | ORAL_TABLET | Freq: Three times a day (TID) | ORAL | 0 refills | Status: DC

## 2020-03-15 NOTE — Telephone Encounter (Signed)
No prob, refilling.

## 2020-03-15 NOTE — Telephone Encounter (Signed)
Patient called to request a refill of amphetamine-dextroamphetamine (ADDERALL) 12.5 MG tablet. She last had a visit 12/13/19 with Dr. Sheppard Coil, she is not due for an appt for a refill yet since she does follow up for her ADHD ever 6 months. T is current covering provider.

## 2020-04-16 ENCOUNTER — Other Ambulatory Visit: Payer: Self-pay | Admitting: Osteopathic Medicine

## 2020-04-16 MED ORDER — AMPHETAMINE-DEXTROAMPHETAMINE 12.5 MG PO TABS: 12.5000 mg | ORAL_TABLET | Freq: Three times a day (TID) | ORAL | 0 refills | Status: DC

## 2020-04-17 ENCOUNTER — Encounter: Payer: Self-pay | Admitting: Osteopathic Medicine

## 2020-04-17 ENCOUNTER — Ambulatory Visit (INDEPENDENT_AMBULATORY_CARE_PROVIDER_SITE_OTHER): Payer: 59 | Admitting: Osteopathic Medicine

## 2020-04-17 ENCOUNTER — Other Ambulatory Visit: Payer: Self-pay

## 2020-04-17 VITALS — BP 126/84 | HR 86 | Temp 98.5°F | Wt 155.1 lb

## 2020-04-17 DIAGNOSIS — L409 Psoriasis, unspecified: Secondary | ICD-10-CM

## 2020-04-17 DIAGNOSIS — Z833 Family history of diabetes mellitus: Secondary | ICD-10-CM

## 2020-04-17 DIAGNOSIS — Z113 Encounter for screening for infections with a predominantly sexual mode of transmission: Secondary | ICD-10-CM | POA: Diagnosis not present

## 2020-04-17 DIAGNOSIS — Z0000 Encounter for general adult medical examination without abnormal findings: Secondary | ICD-10-CM

## 2020-04-17 DIAGNOSIS — F909 Attention-deficit hyperactivity disorder, unspecified type: Secondary | ICD-10-CM | POA: Diagnosis not present

## 2020-04-17 DIAGNOSIS — Z8349 Family history of other endocrine, nutritional and metabolic diseases: Secondary | ICD-10-CM

## 2020-04-17 MED ORDER — CLOBETASOL PROPIONATE 0.05 % EX OINT: 1.0000 "application " | TOPICAL_OINTMENT | Freq: Two times a day (BID) | CUTANEOUS | 1 refills | Status: AC

## 2020-04-17 MED ORDER — TRIAMCINOLONE ACETONIDE 0.1 % EX CREA: 1.0000 "application " | TOPICAL_CREAM | Freq: Two times a day (BID) | CUTANEOUS | 1 refills | Status: AC

## 2020-04-17 NOTE — Progress Notes (Signed)
Kathryn Brady is a 25 y.o. female who presents to  [place] at [facility]  today, 04/17/20, seeking care for the following:  . Refill ADHD medications, current regimen is working well . Concern for worsening psoriasis, typical topical steroids are not helping.  Has some more significant skin lesions consistent with psoriasis on left middle fingertip, left forearm, left neck, right lower extremity. . Was unable to get labs done at annual visit due to some issues with the lab downstairs.  Also at this point requesting routine STD screening.  Requests to go to [place], orders in as below     [place] with other pertinent findings:  The primary encounter diagnosis was Attention deficit hyperactivity disorder (ADHD), unspecified ADHD type. Diagnoses of Annual physical exam, Routine screening for STI (sexually transmitted infection), Psoriasis, Family history of diabetes mellitus, and Family history of thyroid disease were also pertinent to this visit.   1. Annual physical exam Labs ordered for catch-up from previous visit. Annual physical / preventive care was NOT performed or billed today. \  2. Routine screening for STI (sexually transmitted infection) Labs as below  3. Psoriasis meds as below for mild/moder and for severe  Ow threshold for derm referral if these measures not hlping   4. Attention deficit hyperactivity disorder (ADHD), unspecified ADHD type Refilled Rx, ok to follow up evey 6 mos   5. Family history of diabetes mellitus Labs as below include A1C  6. Family history of thyroid disease labsd as belowinclude TSH   There are no Patient Instructions on file for this visit.  Orders Placed This Encounter  Procedures  . Chlamydia/Gonococcus/Trichomonas, NAA  . CMP14+EGFR  . CBC  . Hemoglobin A1c  . TSH  . Lipid panel  . HIV Antibody (routine testing w rflx)  . RPR  . Hepatitis C antibody  . Hepatitis B  core antibody, total  . Hepatitis B surface antigen    Meds ordered this encounter  Medications  . clobetasol ointment (TEMOVATE) 0.05 %    Sig: Apply 1 application topically 2 (two) times daily. As needed for SEVERE psoriasis    Dispense:  30 g    Refill:  1  . triamcinolone (KENALOG) 0.1 %    Sig: Apply 1 application topically 2 (two) times daily. To affected area(s) as needed for MILD-MODERATE psoriasis    Dispense:  80 g    Refill:  1     See below for relevant physical exam findings  See below for recent lab and imaging results reviewed  Medications, allergies, PMH, PSH, SocH, FamH reviewed below    Follow-up instructions: Return in about 6 months (around 10/15/2020) for ANNUAL CHECK-UP - SEE [place] SOONER IF NEEDED.                                       Exam:  BP 126/84 (BP Location: Left Arm, Patient Position: Sitting, Cuff Size: Normal)   Pulse 86   Temp 98.5 F (36.9 C) (Oral)   Wt 155 lb 1.9 oz (70.4 kg)   BMI 29.31 kg/m   Constitutional: VS see above. General Appearance: alert, well-developed, well-nourished, NAD  Neck: No masses, trachea midline.   Respiratory: Normal respiratory effort.  Musculoskeletal: Gait normal.  Neurological: Normal balance/coordination. No tremor.  Skin: warm, dry, intact.   Psychiatric: Normal judgment/insight. Normal mood and affect. Oriented  x3.   Current Meds  Medication Sig  . amphetamine-dextroamphetamine (ADDERALL) 12.5 MG tablet Take 1 tablet by mouth 3 (three) times daily.  Marland Kitchen buPROPion (WELLBUTRIN XL) 300 MG 24 hr tablet Take 1 tablet (300 mg total) by mouth daily.  . citalopram (CELEXA) 40 MG tablet Take 1 tablet (40 mg total) by mouth daily.  . clobetasol ointment (TEMOVATE) 8.88 % Apply 1 application topically 2 (two) times daily. As needed for SEVERE psoriasis  . fexofenadine (ALLEGRA) 180 MG tablet Take 180 mg by mouth daily.  Marland Kitchen triamcinolone (KENALOG) 0.1 % Apply 1 application  topically 2 (two) times daily. To affected area(s) as needed for MILD-MODERATE psoriasis  . [DISCONTINUED] DUREZOL 0.05 % EMUL   . [DISCONTINUED] naproxen (NAPROSYN) 375 MG tablet Take 375 mg by mouth 2 (two) times daily with a meal.  . [DISCONTINUED] predniSONE (DELTASONE) 20 MG tablet Take 1 tablet (20 mg total) by mouth 2 (two) times daily with a meal.    Allergies  Allergen Reactions  . Cetirizine Other (See Comments)  . Amoxicillin Hives  . Aspirin Other (See Comments) and Tinitus  . Desogestrel-Ethinyl Estradiol Nausea Only and Other (See Comments)    Dizziness     Patient Active Problem List   Diagnosis Date Noted  . URI (upper respiratory infection) 08/23/2019  . Acne vulgaris 10/22/2015  . Myopia 07/26/2015  . Cataracts, bilateral 07/26/2015  . ADD (attention deficit disorder) 06/23/2015  . Vaginal discharge 06/23/2015  . 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 Blepharitis 04/18/2013  . 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 Adjustment disorder with mixed anxiety and depressed mood 04/23/2012  . Right shoulder pain 02/23/2012  . 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 Depression Father   . Alcohol abuse Father   . Anxiety disorder Father   . Drug abuse Father   . Hyperlipidemia Father   . Hypertension Father   . Hypothyroidism Mother   . Stroke Maternal Uncle   . Cancer Maternal Grandmother   . Diabetes Maternal Grandmother   . Heart attack Maternal Grandfather   . Heart attack Paternal Grandmother   . Heart attack Paternal Grandfather     Social History   Tobacco Use  Smoking Status Never Smoker  Smokeless Tobacco Never Used    History reviewed. No pertinent surgical history.  Immunization History  Administered Date(s) Administered  . DTaP 05/14/1997, 05/14/1997, 07/13/1997, 07/13/1997, 09/13/1997, 09/13/1997, 12/09/1998, 12/09/1998  .  Hepatitis B 05/14/1997, 07/13/1997, 09/13/1997  . HiB (PRP-OMP) 05/14/1997, 07/13/1997, 09/13/1997  . HiB (PRP-T) 05/14/1997, 07/13/1997, 09/13/1997  . IPV 05/14/1997, 05/14/1997, 07/13/1997, 07/13/1997  . MMR 11/08/1998, 11/08/1998    No results found for this or any previous visit (from the past 2160 hour(s)).  No results found.     All questions at time of visit were answered - patient instructed to contact office with any additional concerns or updates. ER/RTC precautions were reviewed with the patient as applicable.   Please note: manual typing as well as voice recognition software may have been used to produce this document - typos may escape review. Please contact Dr. Sheppard Coil for any needed clarifications.

## 2020-04-17 NOTE — Telephone Encounter (Signed)
FYI

## 2020-04-17 NOTE — Telephone Encounter (Signed)
Pt called requesting for adderall [facility]. Per pt's request, previous rx sent on 03/15/20 cancelled. Rx pended. [facility] [place]

## 2020-04-18 ENCOUNTER — Ambulatory Visit: Payer: 59 | Admitting: Osteopathic Medicine

## 2020-04-19 ENCOUNTER — Other Ambulatory Visit: Payer: Self-pay | Admitting: Osteopathic Medicine

## 2020-04-19 MED ORDER — AMPHETAMINE-DEXTROAMPHETAMINE 12.5 MG PO TABS: 12.5000 mg | ORAL_TABLET | Freq: Three times a day (TID) | ORAL | 0 refills | Status: DC

## 2020-04-22 ENCOUNTER — Other Ambulatory Visit: Payer: Self-pay

## 2020-04-22 NOTE — Telephone Encounter (Signed)
Pt called requesting to switch pharmacies for her adderall rx. [facility] [place] [place]. Rx pended.   S/N: Per pharmacist - large quantity will probably be an issue having rx filled at pharmacies in general.

## 2020-04-23 ENCOUNTER — Other Ambulatory Visit: Payer: Self-pay | Admitting: Osteopathic Medicine

## 2020-04-23 ENCOUNTER — Telehealth: Payer: Self-pay

## 2020-04-23 NOTE — Telephone Encounter (Signed)
Pt called requesting to switch pharmacies again for her adderall rx. Previous [facility] located [facility] [place]. However [facility] is now [facility] [place] [facility] not [facility] in [facility]. Requesting for rx to be [facility] [place].   S/N: Per pharmacist - large quantity will probably be an issue having rx filled at pharmacies in general.

## 2020-04-23 NOTE — Telephone Encounter (Signed)
If pharmacy can fill 30 days supply then that's ok by me - patient needs to confirm w/ pharmacy before we send this again... let her know 30 days is fine and can refill later when due

## 2020-04-24 MED ORDER — AMPHETAMINE-DEXTROAMPHETAMINE 12.5 MG PO TABS
12.5000 mg | ORAL_TABLET | Freq: Three times a day (TID) | ORAL | 0 refills | Status: DC
Start: 2020-04-24 — End: 2020-07-22

## 2020-04-25 NOTE — Telephone Encounter (Signed)
Noted. Pt was made aware of the update.

## 2020-05-30 ENCOUNTER — Other Ambulatory Visit: Payer: Self-pay

## 2020-05-30 ENCOUNTER — Encounter: Payer: Self-pay | Admitting: Family Medicine

## 2020-05-30 ENCOUNTER — Ambulatory Visit (INDEPENDENT_AMBULATORY_CARE_PROVIDER_SITE_OTHER): Payer: 59 | Admitting: Family Medicine

## 2020-05-30 DIAGNOSIS — J029 Acute pharyngitis, unspecified: Secondary | ICD-10-CM | POA: Diagnosis not present

## 2020-05-30 LAB — POCT RAPID STREP A (OFFICE): Rapid Strep A Screen: NEGATIVE

## 2020-05-30 NOTE — Progress Notes (Signed)
Acute Office Visit  Subjective:    Patient ID: Kathryn Brady, female    DOB: 08-05-1995, 25 y.o.   MRN: [id number]  Chief Complaint  Patient presents with  . Sore Throat    HPI Patient is in today for sore throat.  Patient with sore throat that started 5-6 days ago. She describes it as 6/10 pain feeling swollen, sticky, and scratchy. She reports she does notice some difficulty swallowing solids due to the discomfort and has preferred cold liquids and ice cream. She also reports some rhinorrhea and nasal congestion. She tried looking in her throat and reports it is red and swollen, but could not see any spots or swollen tonsils.   She denies headaches, chest pain, shortness of breath, sinus pain/pressure, ear pain, cough, fever/chills, fatigue, loss of taste/smell.  She was recently staying with a friend and taking care of him while he had a sore throat, fever/chills (100.5 F), extreme fatigue, dyspnea on exertion. He is slowly improving. They both have had negative home COVID tests.     Past Medical History:  Diagnosis Date  . Eye problem   . Rheumatoid arthritis [facility]) age 65    No past surgical history on file.  Family History  Problem Relation Age of Onset  . Depression Father   . Alcohol abuse Father   . Anxiety disorder Father   . Drug abuse Father   . Hyperlipidemia Father   . Hypertension Father   . Hypothyroidism Mother   . Stroke Maternal Uncle   . Cancer Maternal Grandmother   . Diabetes Maternal Grandmother   . Heart attack Maternal Grandfather   . Heart attack Paternal Grandmother   . Heart attack Paternal Grandfather     Social History   Socioeconomic History  . Marital status: Single    Spouse name: Not on file  . Number of children: Not on file  . Years of education: Not on file  . Highest education level: Not on file  Occupational History  . Not on file  Tobacco Use  . Smoking status: Never Smoker  . Smokeless tobacco: Never Used  Vaping  Use  . Vaping Use: Never used  Substance and Sexual Activity  . Alcohol use: No  . Drug use: No  . Sexual activity: Yes    Partners: Male  Other Topics Concern  . Not on file  Social History Narrative  . Not on file   Social Determinants of Health   Financial Resource Strain: Not on file  Food Insecurity: Not on file  Transportation Needs: Not on file  Physical Activity: Not on file  Stress: Not on file  Social Connections: Not on file  Intimate Partner Violence: Not on file    Outpatient Medications Prior to Visit  Medication Sig Dispense Refill  . amphetamine-dextroamphetamine (ADDERALL) 12.5 MG tablet Take 1 tablet by mouth 3 (three) times daily. 270 tablet 0  . buPROPion (WELLBUTRIN XL) 300 MG 24 hr tablet Take 1 tablet (300 mg total) by mouth daily. 90 tablet 3  . citalopram (CELEXA) 40 MG tablet Take 1 tablet (40 mg total) by mouth daily. 90 tablet 3  . clobetasol ointment (TEMOVATE) 7.91 % Apply 1 application topically 2 (two) times daily. As needed for SEVERE psoriasis 30 g 1  . fexofenadine (ALLEGRA) 180 MG tablet Take 180 mg by mouth daily.    Marland Kitchen triamcinolone (KENALOG) 0.1 % Apply 1 application topically 2 (two) times daily. To affected area(s) as needed for MILD-MODERATE psoriasis  80 g 1   No facility-administered medications prior to visit.    Allergies  Allergen Reactions  . Cetirizine Other (See Comments)  . Amoxicillin Hives  . Aspirin Other (See Comments) and Tinitus  . Desogestrel-Ethinyl Estradiol Nausea Only and Other (See Comments)    Dizziness     Review of Systems All review of systems negative except what is listed in the HPI     Objective:    Physical Exam Vitals reviewed.  Constitutional:      Appearance: She is well-developed and normal weight.  HENT:     Head: Normocephalic and atraumatic.     Right Ear: Tympanic membrane and ear canal normal.     Left Ear: Tympanic membrane and ear canal normal.     Nose: Rhinorrhea present.      Mouth/Throat:     Mouth: Mucous membranes are moist. No oral lesions.     Pharynx: Uvula midline. Pharyngeal swelling and posterior oropharyngeal erythema present. No oropharyngeal exudate or uvula swelling.     Tonsils: No tonsillar exudate or tonsillar abscesses. 0 on the right. 0 on the left.  Eyes:     Conjunctiva/sclera: Conjunctivae normal.  Neck:     Thyroid: No thyromegaly.  Cardiovascular:     Rate and Rhythm: Normal rate and regular rhythm.     Heart sounds: Normal heart sounds.  Pulmonary:     Effort: Pulmonary effort is normal.     Breath sounds: Normal breath sounds.  Abdominal:     Palpations: Abdomen is soft.  Musculoskeletal:     Cervical back: Normal range of motion and neck supple.  Lymphadenopathy:     Cervical: No cervical adenopathy.  Skin:    General: Skin is warm and dry.     Capillary Refill: Capillary refill takes less than 2 seconds.  Neurological:     General: No focal deficit present.     Mental Status: She is alert and oriented to person, place, and time.  Psychiatric:        Mood and Affect: Mood normal.        Behavior: Behavior normal.     BP 136/79   Pulse 99   Temp 98.6 F (37 C)   SpO2 99%  Wt Readings from Last 3 Encounters:  04/17/20 155 lb 1.9 oz (70.4 kg)  12/26/19 151 lb 1.9 oz (68.5 kg)  12/13/19 155 lb (70.3 kg)    Health Maintenance Due  Topic Date Due  . Hepatitis C Screening  Never done  . HPV VACCINES (1 - 2-dose series) Never done  . HIV Screening  Never done  . PAP-Cervical Cytology Screening  Never done  . PAP SMEAR-Modifier  Never done       Topic Date Due  . HPV VACCINES (1 - 2-dose series) Never done     No results found for: TSH No results found for: WBC, HGB, HCT, MCV, PLT No results found for: NA, K, CHLORIDE, CO2, GLUCOSE, BUN, CREATININE, BILITOT, ALKPHOS, AST, ALT, PROT, ALBUMIN, CALCIUM, ANIONGAP, EGFR, GFR No results found for: CHOL No results found for: HDL No results found for: LDLCALC No  results found for: TRIG No results found for: CHOLHDL No results found for: HGBA1C     Assessment & Plan:   1. Sore throat  Patient appears well overall, major complaint is sore throat. No lymphadenopathy, rash, fatigue, palatal petechiae, or fevers suggestive of mono at this time. Strep test in office was negative. Likely viral in etiology. Recommend  supportive measures including rest, hydration, soothing liquids, salt water gargles, OTC analgesics. Educated on signs and symptoms requiring further evaluation.  Follow-up if symptoms worsen or fail to improve.   Terrilyn Saver, NP

## 2020-05-30 NOTE — Patient Instructions (Signed)
Pharyngitis  Pharyngitis is a sore throat (pharynx). This is when there is redness, pain, and swelling in your throat. Most of the time, this condition gets better on its own. In some cases, you may need medicine. Follow these instructions at home:  Take over-the-counter and prescription medicines only as told by your doctor. ? If you were prescribed an antibiotic medicine, take it as told by your doctor. Do not stop taking the antibiotic even if you start to feel better. ? Do not give children aspirin. Aspirin has been linked to Reye syndrome.  Drink enough water and fluids to keep your pee (urine) clear or pale yellow.  Get a lot of rest.  Rinse your mouth (gargle) with a salt-water mixture 3-4 times a day or as needed. To make a salt-water mixture, completely dissolve -1 tsp of salt in 1 cup of warm water.  If your doctor approves, you may use throat lozenges or sprays to soothe your throat. Contact a doctor if:  You have large, tender lumps in your neck.  You have a rash.  You cough up green, yellow-brown, or bloody spit. Get help right away if:  You have a stiff neck.  You drool or cannot swallow liquids.  You cannot drink or take medicines without throwing up.  You have very bad pain that does not go away with medicine.  You have problems breathing, and it is not from a stuffy nose.  You have new pain and swelling in your knees, ankles, wrists, or elbows. Summary  Pharyngitis is a sore throat (pharynx). This is when there is redness, pain, and swelling in your throat.  If you were prescribed an antibiotic medicine, take it as told by your doctor. Do not stop taking the antibiotic even if you start to feel better.  Most of the time, pharyngitis gets better on its own. Sometimes, you may need medicine. This information is not intended to replace advice given to you by your health care provider. Make sure you discuss any questions you have with your health care  provider. Document Revised: 02/05/2017 Document Reviewed: 03/31/2016 Elsevier Patient Education  2021 [employer].

## 2020-06-12 ENCOUNTER — Encounter: Payer: 59 | Admitting: Osteopathic Medicine

## 2020-07-22 ENCOUNTER — Other Ambulatory Visit: Payer: Self-pay

## 2020-07-23 MED ORDER — AMPHETAMINE-DEXTROAMPHETAMINE 12.5 MG PO TABS: 12.5000 mg | ORAL_TABLET | Freq: Three times a day (TID) | ORAL | 0 refills | Status: DC

## 2020-08-20 ENCOUNTER — Encounter: Payer: Self-pay | Admitting: Osteopathic Medicine

## 2020-08-20 ENCOUNTER — Telehealth (INDEPENDENT_AMBULATORY_CARE_PROVIDER_SITE_OTHER): Payer: 59 | Admitting: Osteopathic Medicine

## 2020-08-20 DIAGNOSIS — J019 Acute sinusitis, unspecified: Secondary | ICD-10-CM | POA: Diagnosis not present

## 2020-08-20 MED ORDER — IPRATROPIUM BROMIDE 0.06 % NA SOLN: 2.0000 | Freq: Four times a day (QID) | NASAL | 1 refills | Status: DC

## 2020-08-20 MED ORDER — AZITHROMYCIN 250 MG PO TABS: ORAL_TABLET | ORAL | 0 refills | Status: AC

## 2020-08-20 NOTE — Progress Notes (Signed)
Telemedicine Visit via  Audio only - telephone (patient preference /  technical difficulty with MyChart video application)  I connected with [facility] [place] on 08/20/20 at 10:27 AM  by phone or  telemedicine application as noted above  I verified that I am speaking with or regarding  the correct patient using two identifiers.  Participants: Myself, Dr Emeterio Reeve DO Patient: Kathryn Brady Patient proxy if applicable: none Other, if applicable: none  Patient is at home I am in office at [facility]    I discussed the limitations of evaluation and management  by telemedicine and the availability of in person appointments.  The participant(s) above expressed understanding and  agreed to proceed with this appointment via telemedicine.       History of Present Illness: Kathryn Brady is a 25 y.o. female who would like to discuss sinus issues. Bad congestion, runny nose. No coughing. Having headaches. Ongoing about a week. 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     Observations/Objective: There were no vitals taken for this visit. BP Readings from Last 3 Encounters:  05/30/20 136/79  04/17/20 126/84  12/26/19 124/79   Exam: Normal Speech.  NAD  Lab and Radiology Results No results found for this or any previous visit (from the past 72 hour(s)). No results found.     Assessment and Plan: 25 y.o. female with The encounter diagnosis was Acute sinusitis, recurrence not specified, unspecified location.   PDMP not reviewed this encounter. No orders of the defined types were placed in this encounter.  Meds ordered this encounter  Medications   azithromycin (ZITHROMAX) 250 MG tablet    Sig: Take 2 tablets on day 1, then 1 tablet daily on days 2 through 5    Dispense:  6 tablet    Refill:  0   ipratropium (ATROVENT) 0.06 % nasal spray    Sig: Place 2 sprays into both nostrils 4 (four) times daily. As needed for runny nose / postnasal drip    Dispense:   15 mL    Refill:  1    Follow Up Instructions: Return if symptoms worsen or fail to improve.    I discussed the assessment and treatment plan with the patient. The patient was provided an opportunity to ask questions and all were answered. The patient agreed with the plan and demonstrated an understanding of the instructions.   The patient was advised to call back or seek an in-person evaluation if any new concerns, if symptoms worsen or if the condition fails to improve as anticipated.  15 minutes of non-face-to-face time was provided during this encounter.      . . . . . . . . . . . . . Marland Kitchen                   Historical information moved to improve visibility of documentation.  Past Medical History:  Diagnosis Date   Eye problem    Rheumatoid arthritis [facility]) age 56   No past surgical history on file. Social History   Tobacco Use   Smoking status: Never   Smokeless tobacco: Never  Substance Use Topics   Alcohol use: No   family history includes Alcohol abuse in her father; Anxiety disorder in her father; Cancer in her maternal grandmother; Depression in her father; Diabetes in her maternal grandmother; Drug abuse in her father; Heart attack in her maternal grandfather, paternal grandfather, and paternal grandmother; Hyperlipidemia in her father; Hypertension in her father; Hypothyroidism in her  mother; Stroke in her maternal uncle.  Medications: Current Outpatient Medications  Medication Sig Dispense Refill   amphetamine-dextroamphetamine (ADDERALL) 12.5 MG tablet Take 1 tablet by mouth 3 (three) times daily. 270 tablet 0   azithromycin (ZITHROMAX) 250 MG tablet Take 2 tablets on day 1, then 1 tablet daily on days 2 through 5 6 tablet 0   buPROPion (WELLBUTRIN XL) 300 MG 24 hr tablet Take 1 tablet (300 mg total) by mouth daily. 90 tablet 3   citalopram (CELEXA) 40 MG tablet Take 1 tablet (40 mg total) by mouth daily. 90 tablet 3   clobetasol  ointment (TEMOVATE) 2.92 % Apply 1 application topically 2 (two) times daily. As needed for SEVERE psoriasis 30 g 1   fexofenadine (ALLEGRA) 180 MG tablet Take 180 mg by mouth daily.     ipratropium (ATROVENT) 0.06 % nasal spray Place 2 sprays into both nostrils 4 (four) times daily. As needed for runny nose / postnasal drip 15 mL 1   triamcinolone (KENALOG) 0.1 % Apply 1 application topically 2 (two) times daily. To affected area(s) as needed for MILD-MODERATE psoriasis 80 g 1   No current facility-administered medications for this visit.   Allergies  Allergen Reactions   Cetirizine Other (See Comments)   Amoxicillin Hives   Aspirin Other (See Comments) and Tinitus   Desogestrel-Ethinyl Estradiol Nausea Only and Other (See Comments)    Dizziness      If phone visit, billing and coding can please add appropriate modifier if needed

## 2020-09-03 ENCOUNTER — Other Ambulatory Visit: Payer: Self-pay | Admitting: Osteopathic Medicine

## 2020-10-16 ENCOUNTER — Encounter: Payer: Self-pay | Admitting: Osteopathic Medicine

## 2020-10-16 ENCOUNTER — Telehealth (INDEPENDENT_AMBULATORY_CARE_PROVIDER_SITE_OTHER): Payer: 59 | Admitting: Osteopathic Medicine

## 2020-10-16 VITALS — Temp 98.4°F | Wt 140.0 lb

## 2020-10-16 DIAGNOSIS — H209 Unspecified iridocyclitis: Secondary | ICD-10-CM

## 2020-10-16 DIAGNOSIS — L409 Psoriasis, unspecified: Secondary | ICD-10-CM

## 2020-10-16 DIAGNOSIS — F909 Attention-deficit hyperactivity disorder, unspecified type: Secondary | ICD-10-CM | POA: Diagnosis not present

## 2020-10-16 DIAGNOSIS — Z0000 Encounter for general adult medical examination without abnormal findings: Secondary | ICD-10-CM | POA: Diagnosis not present

## 2020-10-16 DIAGNOSIS — Z8349 Family history of other endocrine, nutritional and metabolic diseases: Secondary | ICD-10-CM

## 2020-10-16 DIAGNOSIS — M0800 Unspecified juvenile rheumatoid arthritis of unspecified site: Secondary | ICD-10-CM | POA: Diagnosis not present

## 2020-10-16 DIAGNOSIS — Z113 Encounter for screening for infections with a predominantly sexual mode of transmission: Secondary | ICD-10-CM

## 2020-10-16 MED ORDER — CITALOPRAM HYDROBROMIDE 40 MG PO TABS: 40.0000 mg | ORAL_TABLET | Freq: Every day | ORAL | 3 refills | Status: DC

## 2020-10-16 MED ORDER — AMPHETAMINE-DEXTROAMPHETAMINE 12.5 MG PO TABS: 12.5000 mg | ORAL_TABLET | Freq: Three times a day (TID) | ORAL | 0 refills | Status: DC

## 2020-10-16 MED ORDER — BUPROPION HCL ER (XL) 300 MG PO TB24: 300.0000 mg | ORAL_TABLET | Freq: Every day | ORAL | 3 refills | Status: DC

## 2020-10-16 NOTE — Progress Notes (Signed)
Telemedicine Visit via  Audio only - telephone (patient preference /  technical difficulty with MyChart video application)  I connected with Kathryn Brady on 10/16/20 at 9:42 AM  by phone or  telemedicine application as noted above  I verified that I am speaking with or regarding  the correct patient using two identifiers.  Participants: Myself, Dr Emeterio Reeve DO Patient: Kathryn Brady Patient proxy if applicable: NONE Other, if applicable: NONE  Patient is at home I am in office at [facility]    I discussed the limitations of evaluation and management  by telemedicine and the availability of in person appointments.  The participant(s) above expressed understanding and  agreed to proceed with this appointment via telemedicine.       History of Present Illness: Kathryn Brady is a 25 y.o. female who would like to discuss ANNUAL PHYSICAL      Observations/Objective: Temp 98.4 F (36.9 C)   Wt 140 lb (63.5 kg)   BMI 26.45 kg/m  BP Readings from Last 3 Encounters:  05/30/20 136/79  04/17/20 126/84  12/26/19 124/79   Exam: Normal Speech.  NAD  Lab and Radiology Results No results found for this or any previous visit (from the past 72 hour(s)). No results found.   Immunization History  Administered Date(s) Administered   DTaP 05/14/1997, 05/14/1997, 07/13/1997, 07/13/1997, 09/13/1997, 09/13/1997, 12/09/1998, 12/09/1998   Hepatitis B 05/14/1997, 07/13/1997, 09/13/1997   HiB (PRP-OMP) 05/14/1997, 07/13/1997, 09/13/1997   HiB (PRP-T) 05/14/1997, 07/13/1997, 09/13/1997   IPV 05/14/1997, 05/14/1997, 07/13/1997, 07/13/1997   MMR 11/08/1998, 11/08/1998     Assessment and Plan: 25 y.o. female with The primary encounter diagnosis was Annual physical exam. 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  Hx vaccine reactions, f/u w/ rheumatology    PDMP not reviewed this encounter. Orders Placed This Encounter  Procedures   C. trachomatis/N. gonorrhoeae RNA   Trichomonas vaginalis, RNA   CBC   COMPLETE METABOLIC PANEL WITH GFR   Lipid panel   TSH   HIV Antibody (routine testing w rflx)   Hepatitis C antibody   RPR   Hepatitis B core antibody, total   Hepatitis B surface antigen   Ambulatory referral to Rheumatology    Referral Priority:   Routine    Referral Type:   Consultation    Referral Reason:   Specialty Services Required    Requested Specialty:   Rheumatology    Number of Visits Requested:   1   Meds ordered this encounter  Medications   citalopram (CELEXA) 40 MG tablet    Sig: Take 1 tablet (40 mg total) by mouth daily.    Dispense:  90 tablet    Refill:  3   buPROPion (WELLBUTRIN XL) 300 MG 24 hr tablet    Sig: Take 1 tablet (300 mg total) by mouth daily.    Dispense:  90 tablet    Refill:  3   amphetamine-dextroamphetamine (ADDERALL) 12.5 MG tablet    Sig: Take 1 tablet by mouth 3 (three) times daily.    Dispense:  270 tablet    Refill:  0   Patient Instructions  General Preventive Care Most recent routine screening labs: ORDERED.  Blood pressure goal 130/80 or less.  Tobacco: don't!  Alcohol: responsible moderation is ok for most adults - if  you have concerns about your alcohol intake, please talk to me! Exercise: as tolerated to reduce risk of cardiovascular disease and diabetes. Strength training will also prevent osteoporosis.  Mental health: if need for mental health care (medicines, counseling, other), or concerns about moods, please let me know!  Sexual / Reproductive health: if need for STD testing, or if concerns with libido/pain problems, please let me know! If you need to discuss family planning, please let me know!  Advanced Directive:  Living Will and/or Healthcare Power of Attorney recommended for all adults, regardless of age or health.  Vaccines - check w/ Rheumatology  Flu vaccine: for almost everyone, every fall.  Shingles vaccine: after age 75.  Pneumonia vaccines: after age 8, or sooner if certain medical conditions. Tetanus booster: every 10 years / 3rd trimester of pregnancy HPV vaccine: Gardasil up to age 42 to prevent HPV-associated diseases, including certain cancers.  COVID vaccine: STRONGLY RECOMMENDED  Cancer screenings  Colon cancer screening: for everyone age 75-75 Breast cancer screening: mammogram at age 9 Cervical cancer screening: Pap every 3 years for people aged 21-30, every 5 years after age 41  Lung cancer screening: not needed for non-smokers  Infection screenings  HIV: recommended screening at least once age 52-65, more often as needed. Gonorrhea/Chlamydia, other STI: screening as needed Hepatitis C: recommended once for everyone age 10-93 TB: certain at-risk populations, or depending on work requirements and/or travel history Other Bone Density Test: recommended at age 11  Instructions sent via [place].   Follow Up Instructions: Return for PHLEBOTOMOST VISIT FOR LABS. CALL IN 6 MOS FOR ADHD FOLLOWUP.    I discussed the assessment and treatment plan with the patient. The patient was provided an opportunity to ask questions and all were answered. The patient agreed with the plan and demonstrated an understanding of the instructions.   The patient was advised to call back or seek an in-person evaluation if any new concerns, if symptoms worsen or if the condition fails to improve as anticipated.  30 minutes of non-face-to-face time was provided during this encounter.      . . . . . . . . . . . . . Marland Kitchen                   Historical information moved to improve visibility of documentation.  Past Medical History:  Diagnosis Date   Eye problem    Rheumatoid  arthritis [facility]) age 52   No past surgical history on file. Social History   Tobacco Use   Smoking status: Never   Smokeless tobacco: Never  Substance Use Topics   Alcohol use: No   family history includes Alcohol abuse in her father; Anxiety disorder in her father; Cancer in her maternal grandmother; Depression in her father; Diabetes in her maternal grandmother; Drug abuse in her father; Heart attack in her maternal grandfather, paternal grandfather, and paternal grandmother; Hyperlipidemia in her father; Hypertension in her father; Hypothyroidism in her mother; Stroke in her maternal uncle.  Medications: Current Outpatient Medications  Medication Sig Dispense Refill   clobetasol ointment (TEMOVATE) 2.35 % Apply 1 application topically 2 (two) times daily. As needed for SEVERE psoriasis 30 g 1   fexofenadine (ALLEGRA) 180 MG tablet Take 180 mg by mouth daily.     ipratropium (ATROVENT) 0.06 % nasal spray Place 2 sprays into both nostrils 4 (four) times daily. As needed for runny nose / postnasal drip 45 mL 1   triamcinolone (KENALOG) 0.1 % Apply  1 application topically 2 (two) times daily. To affected area(s) as needed for MILD-MODERATE psoriasis 80 g 1   amphetamine-dextroamphetamine (ADDERALL) 12.5 MG tablet Take 1 tablet by mouth 3 (three) times daily. 270 tablet 0   buPROPion (WELLBUTRIN XL) 300 MG 24 hr tablet Take 1 tablet (300 mg total) by mouth daily. 90 tablet 3   citalopram (CELEXA) 40 MG tablet Take 1 tablet (40 mg total) by mouth daily. 90 tablet 3   No current facility-administered medications for this visit.   Allergies  Allergen Reactions   Cetirizine Other (See Comments)   Amoxicillin Hives   Aspirin Other (See Comments) and Tinitus   Desogestrel-Ethinyl Estradiol Nausea Only and Other (See Comments)    Dizziness      If phone visit, billing and coding can please add appropriate modifier if needed

## 2020-10-16 NOTE — Patient Instructions (Signed)
General Preventive Care Most recent routine screening labs: ORDERED.  Blood pressure goal 130/80 or less.  Tobacco: don't!  Alcohol: responsible moderation is ok for most adults - if you have concerns about your alcohol intake, please talk to me! Exercise: as tolerated to reduce risk of cardiovascular disease and diabetes. Strength training will also prevent osteoporosis.  Mental health: if need for mental health care (medicines, counseling, other), or concerns about moods, please let me know!  Sexual / Reproductive health: if need for STD testing, or if concerns with libido/pain problems, please let me know! If you need to discuss family planning, please let me know!  Advanced Directive: Living Will and/or Healthcare Power of Attorney recommended for all adults, regardless of age or health.  Vaccines - check w/ Rheumatology  Flu vaccine: for almost everyone, every fall.  Shingles vaccine: after age 45.  Pneumonia vaccines: after age 23, or sooner if certain medical conditions. Tetanus booster: every 10 years / 3rd trimester of pregnancy HPV vaccine: Gardasil up to age 10 to prevent HPV-associated diseases, including certain cancers.  COVID vaccine: STRONGLY RECOMMENDED  Cancer screenings  Colon cancer screening: for everyone age 84-75 Breast cancer screening: mammogram at age 32 Cervical cancer screening: Pap every 3 years for people aged 21-30, every 5 years after age 81  Lung cancer screening: not needed for non-smokers  Infection screenings  HIV: recommended screening at least once age 19-65, more often as needed. Gonorrhea/Chlamydia, other STI: screening as needed Hepatitis C: recommended once for everyone age 0000000 TB: certain at-risk populations, or depending on work requirements and/or travel history Other Bone Density Test: recommended at age 7

## 2020-12-19 ENCOUNTER — Encounter: Payer: Self-pay | Admitting: Osteopathic Medicine

## 2020-12-19 ENCOUNTER — Telehealth: Payer: Self-pay

## 2020-12-19 NOTE — Telephone Encounter (Signed)
Error

## 2020-12-19 NOTE — Telephone Encounter (Signed)
Kathryn Brady called and states she has been instructed to always bring her Adderall prescription bottle to work if she takes it during the day. She has missed placed the bottle. She wanted to know if she could get another prescription. Please advise.

## 2020-12-20 MED ORDER — AMPHETAMINE-DEXTROAMPHETAMINE 12.5 MG PO TABS: 12.5000 mg | ORAL_TABLET | Freq: Three times a day (TID) | ORAL | 0 refills | Status: DC

## 2020-12-20 NOTE — Telephone Encounter (Signed)
Attempted to advise patient of refilled Rx. No answer. VM full.

## 2021-01-29 ENCOUNTER — Ambulatory Visit (INDEPENDENT_AMBULATORY_CARE_PROVIDER_SITE_OTHER): Payer: 59 | Admitting: Medical-Surgical

## 2021-01-29 ENCOUNTER — Encounter: Payer: Self-pay | Admitting: Medical-Surgical

## 2021-01-29 DIAGNOSIS — Z7689 Persons encountering health services in other specified circumstances: Secondary | ICD-10-CM

## 2021-01-29 DIAGNOSIS — F900 Attention-deficit hyperactivity disorder, predominantly inattentive type: Secondary | ICD-10-CM

## 2021-01-29 DIAGNOSIS — F4323 Adjustment disorder with mixed anxiety and depressed mood: Secondary | ICD-10-CM

## 2021-01-29 MED ORDER — AMPHETAMINE-DEXTROAMPHETAMINE 12.5 MG PO TABS: 12.5000 mg | ORAL_TABLET | Freq: Three times a day (TID) | ORAL | 0 refills | Status: DC

## 2021-01-29 NOTE — Progress Notes (Signed)
Virtual Visit via Video Note  I connected with Kathryn Brady on 01/29/21 at  1:40 PM EST by a video enabled telemedicine application and verified that I am speaking with the correct person using two identifiers.   I discussed the limitations of evaluation and management by telemedicine and the availability of in person appointments. The patient expressed understanding and agreed to proceed.  Patient location: patient home Provider locations: provider home  Subjective:    CC: transfer of care  HPI: Pleasant 25 year old female presenting via [facility] video visit to transfer care to a new PCP and discuss mood/ADD.   Mood- taking Celexa 40mg  and Wellbutrin 300mg  daily, tolerating well without side effects. Has been on this since she was about 25 years old. Feels this regimen keeps her mood fairly stable but does have some breakthrough panic attacks on occasion.   ADD- taking Adderall 12.5mg  IR three times daily, tolerating well. Has been on this regimen for quite a while and feels that it keeps her symptoms well managed. Rarely skips a dose and only does this when she feels like she needs extra sleep. No fluctuations in weight or appetite changes. No difficulty with sleeping.   Past medical history, Surgical history, Family history not pertinant except as noted below, Social history, Allergies, and medications have been entered into the medical record, reviewed, and corrections made.   Review of Systems: See HPI for pertinent positives and negatives.   Objective:    General: Speaking clearly in complete sentences without any shortness of breath.  Alert and oriented x3.  Normal judgment. No apparent acute distress.  Impression and Recommendations:    1. Encounter to establish care Reviewed available information and discussed care concerns with patient.   2. Attention deficit hyperactivity disorder (ADHD), predominantly inattentive type Continue Adderall 12.5mg  IR TID. Sending  prescription to my supervising MD due to patient request for 90 day supply and restrictions on prescribing for NPs in [place]. Since she is on a stable regimen and has been for over a year, we will follow up in about 6 months.  3. Adjustment disorder with mixed anxiety and depressed mood Continue Celexa 40mg  and Wellbutrin 300mg  daily. Some breakthrough symptoms can be expected, especially situationally. Recommend self-calming methods to manage this for now. If this worsens, may benefit from BuSpar or hydroxyzine on a prn basis.   I discussed the assessment and treatment plan with the patient. The patient was provided an opportunity to ask questions and all were answered. The patient agreed with the plan and demonstrated an understanding of the instructions.   The patient was advised to call back or seek an in-person evaluation if the symptoms worsen or if the condition fails to improve as anticipated.  25 minutes of non-face-to-face time was provided during this encounter.  Return in about 6 months (around 07/29/2021) for mood/ADHD follow up.  Clearnce Sorrel, DNP, APRN, FNP-BC [facility]

## 2021-01-29 NOTE — Progress Notes (Signed)
Rx for 90 day supply of adderall signed.

## 2021-03-21 ENCOUNTER — Ambulatory Visit (INDEPENDENT_AMBULATORY_CARE_PROVIDER_SITE_OTHER): Payer: 59 | Admitting: Physician Assistant

## 2021-03-21 ENCOUNTER — Other Ambulatory Visit: Payer: Self-pay

## 2021-03-21 ENCOUNTER — Encounter: Payer: Self-pay | Admitting: Physician Assistant

## 2021-03-21 VITALS — BP 152/72 | HR 98 | Ht 61.0 in | Wt 171.1 lb

## 2021-03-21 DIAGNOSIS — H1013 Acute atopic conjunctivitis, bilateral: Secondary | ICD-10-CM | POA: Diagnosis not present

## 2021-03-21 DIAGNOSIS — R030 Elevated blood-pressure reading, without diagnosis of hypertension: Secondary | ICD-10-CM

## 2021-03-21 DIAGNOSIS — J0140 Acute pansinusitis, unspecified: Secondary | ICD-10-CM | POA: Insufficient documentation

## 2021-03-21 MED ORDER — AZITHROMYCIN 250 MG PO TABS: ORAL_TABLET | ORAL | 0 refills | Status: DC

## 2021-03-21 MED ORDER — METHYLPREDNISOLONE SODIUM SUCC 125 MG IJ SOLR
125.0000 mg | Freq: Once | INTRAMUSCULAR | Status: AC
  Administered 2021-03-21: 125 mg via INTRAMUSCULAR

## 2021-03-21 NOTE — Patient Instructions (Addendum)
Solumedrol given today Start zpak Use ketoprofen eye drops OTC Start zyrtec at bedtime If not improving by Monday let us know Allergic Conjunctivitis, Adult Allergic conjunctivitis is inflammation of the conjunctiva. The conjunctiva is the thin, clear membrane that covers the white part of the eye and the inner surface of the eyelid. In this condition: The blood vessels in the conjunctiva become irritated and swell. The eyes become red or pink and feel itchy. Allergic conjunctivitis cannot be spread from person to person. This condition can develop at any age and may be outgrown. What are the causes? This condition is caused by allergens. These are things that can cause an allergic reaction in some people but not in other people. Common allergens include: Outdoor allergens, such as: Pollen, including pollen from grass and weeds. Mold spores. Car fumes. Indoor allergens, such as: Dust. Smoke. Mold spores. Proteins in a pet's urine, saliva, or dander. What increases the risk? You may be more likely to develop this condition if you have a family history of these things: Allergies. Conditions caused by being exposed to allergens, such as: Allergic rhinitis. This is an allergic reaction that affects the nose. Bronchial asthma. This condition affects the large airways in the lungs and makes breathing difficult. Atopic dermatitis (eczema). This is inflammation of the skin that is long-term (chronic). What are the signs or symptoms? Symptoms of this condition include eyes that are: Itchy. Red. Watery. Puffy. Your eyes may also: Sting or burn. Have clear fluid draining from them. Have thick mucus discharge and pain (vernal conjunctivitis). How is this diagnosed? This condition may be diagnosed by: Your medical history. A physical exam. Tests of the fluid draining from your eyes to rule out other causes. Other tests to confirm the diagnosis, including: Testing for allergies. The  skin may be pricked with a tiny needle. The pricked area is then exposed to small amounts of allergens. Testing for other eye conditions. Tests may include: Blood tests. Tissue scrapings from your eyelid. The tissue is then checked under a microscope. How is this treated? This condition may be treated with: Cold, wet cloths (cold compresses) to soothe itching and swelling. Washing the face to remove allergens. Eye drops. These may be prescription or over-the-counter. You may need to try different types to see which one works best for you, such as: Eye drops that block the allergic reaction (antihistamine). Eye drops that reduce swelling and irritation (anti-inflammatory). Steroid eye drops, which may be given if other treatments have not worked (vernal conjunctivitis). Oral antihistamine medicines. These are medicines taken by mouth to lessen your allergic reaction. You may need these if eye drops do not help or are difficult to use. Follow these instructions at home: Eye care Apply a clean, cold compress to your eyes for 10-20 minutes, 3-4 times a day. Do not touch or rub your eyes. Do not wear contact lenses until the inflammation is gone. Wear glasses instead. Do not wear eye makeup until the inflammation is gone. General instructions Avoid known allergens whenever possible. Take or apply over-the-counter and prescription medicines only as told by your health care provider. These include any eye drops. Drink enough fluid to keep your urine pale yellow. Keep all follow-up visits as told by your health care provider. This is important. Contact a health care provider if: Your symptoms get worse or do not get better with treatment. You have mild eye pain. You become sensitive to light. You have spots or blisters on your eyes. You have  pus draining from your eyes. You have a fever. Get help right away if: You have redness, swelling, or other symptoms in only one eye. Your vision is  blurred or you have other vision changes. You have severe eye pain. Summary Allergic conjunctivitis is inflammation of the clear membrane that covers the white part of the eye and the inner surface of the eyelid. Take or apply over-the-counter and prescription medicines only as told by your health care provider. These include eye drops. Do not touch or rub your eyes. Contact a health care provider if your symptoms get worse or do not get better with treatment. This information is not intended to replace advice given to you by your health care provider. Make sure you discuss any questions you have with your health care provider. Document Revised: 01/16/2019 Document Reviewed: 01/16/2019 Elsevier Patient Education  2022 [place]. Sinusitis, Adult Sinusitis is inflammation of your sinuses. Sinuses are hollow spaces in the bones around your face. Your sinuses are located: Around your eyes. In the middle of your forehead. Behind your nose. In your cheekbones. Mucus normally drains out of your sinuses. When your nasal tissues become inflamed or swollen, mucus can become trapped or blocked. This allows bacteria, viruses, and fungi to grow, which leads to infection. Most infections of the sinuses are caused by a virus. Sinusitis can develop quickly. It can last for up to 4 weeks (acute) or for more than 12 weeks (chronic). Sinusitis often develops after a cold. What are the causes? This condition is caused by anything that creates swelling in the sinuses or stops mucus from draining. This includes: Allergies. Asthma. Infection from bacteria or viruses. Deformities or blockages in your nose or sinuses. Abnormal growths in the nose (nasal polyps). Pollutants, such as chemicals or irritants in the air. Infection from fungi (rare). What increases the risk? You are more likely to develop this condition if you: Have a weak body defense system (immune system). Do a lot of swimming or  diving. Overuse nasal sprays. Smoke. What are the signs or symptoms? The main symptoms of this condition are pain and a feeling of pressure around the affected sinuses. Other symptoms include: Stuffy nose or congestion. Thick drainage from your nose. Swelling and warmth over the affected sinuses. Headache. Upper toothache. A cough that may get worse at night. Extra mucus that collects in the throat or the back of the nose (postnasal drip). Decreased sense of smell and taste. Fatigue. A fever. Sore throat. Bad breath. How is this diagnosed? This condition is diagnosed based on: Your symptoms. Your medical history. A physical exam. Tests to find out if your condition is acute or chronic. This may include: Checking your nose for nasal polyps. Viewing your sinuses using a device that has a light (endoscope). Testing for allergies or bacteria. Imaging tests, such as an MRI or CT scan. In rare cases, a bone biopsy may be done to rule out more serious types of fungal sinus disease. How is this treated? Treatment for sinusitis depends on the cause and whether your condition is chronic or acute. If caused by a virus, your symptoms should go away on their own within 10 days. You may be given medicines to relieve symptoms. They include: Medicines that shrink swollen nasal passages (topical intranasal decongestants). Medicines that treat allergies (antihistamines). A spray that eases inflammation of the nostrils (topical intranasal corticosteroids). Rinses that help get rid of thick mucus in your nose (nasal saline washes). If caused by bacteria, your  health care provider may recommend waiting to see if your symptoms improve. Most bacterial infections will get better without antibiotic medicine. You may be given antibiotics if you have: A severe infection. A weak immune system. If caused by narrow nasal passages or nasal polyps, you may need to have surgery. Follow these instructions at  home: Medicines Take, use, or apply over-the-counter and prescription medicines only as told by your health care provider. These may include nasal sprays. If you were prescribed an antibiotic medicine, take it as told by your health care provider. Do not stop taking the antibiotic even if you start to feel better. Hydrate and humidify  Drink enough fluid to keep your urine pale yellow. Staying hydrated will help to thin your mucus. Use a cool mist humidifier to keep the humidity level in your home above 50%. Inhale steam for 10-15 minutes, 3-4 times a day, or as told by your health care provider. You can do this in the bathroom while a hot shower is running. Limit your exposure to cool or dry air. Rest Rest as much as possible. Sleep with your head raised (elevated). Make sure you get enough sleep each night. General instructions  Apply a warm, moist washcloth to your face 3-4 times a day or as told by your health care provider. This will help with discomfort. Wash your hands often with soap and water to reduce your exposure to germs. If soap and water are not available, use hand sanitizer. Do not smoke. Avoid being around people who are smoking (secondhand smoke). Keep all follow-up visits as told by your health care provider. This is important. Contact a health care provider if: You have a fever. Your symptoms get worse. Your symptoms do not improve within 10 days. Get help right away if: You have a severe headache. You have persistent vomiting. You have severe pain or swelling around your face or eyes. You have vision problems. You develop confusion. Your neck is stiff. You have trouble breathing. Summary Sinusitis is soreness and inflammation of your sinuses. Sinuses are hollow spaces in the bones around your face. This condition is caused by nasal tissues that become inflamed or swollen. The swelling traps or blocks the flow of mucus. This allows bacteria, viruses, and fungi to  grow, which leads to infection. If you were prescribed an antibiotic medicine, take it as told by your health care provider. Do not stop taking the antibiotic even if you start to feel better. Keep all follow-up visits as told by your health care provider. This is important. This information is not intended to replace advice given to you by your health care provider. Make sure you discuss any questions you have with your health care provider. Document Revised: 07/26/2017 Document Reviewed: 07/26/2017 Elsevier Patient Education  2022 [employer].

## 2021-03-21 NOTE — Progress Notes (Signed)
Acute Office Visit  Subjective:    Patient ID: Kathryn Brady, female    DOB: 1996-02-10, 26 y.o.   MRN: [id number]  Chief Complaint  Patient presents with   Conjunctivitis    HPI 26 YO female presents to the clinic for bilateral eye redness and itching. Patient reports her eyes have been injected and itchy for 4 days. She has noticed yellow discharge and her eyes are crusty in the morning. She tried OTC antihistamine drops which made her eyes burn. She also tried saline drops that she thinks may have helped. Patient took a zyrtec x1 and reports that she has seasonal allergies. She also has a cat and 2 sugar gliders, which she thinks she may be allergic to. She denies any vision changes. 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 She has been treated with pred forte and MTX in the past but not recently. No vision changes.   Patient reports that she has also been unable to get over a likely sinus infection. Her symptoms started around christmas. She has sinus congestion, chest congestion, productive cough, and bilateral ear pain. She was not treated at this time and her symptoms have persisted. She denies any shortness of breath, chest pain or fever.   Past Medical History:  Diagnosis Date   Eye problem    Rheumatoid arthritis [facility]) age 69    History reviewed. No pertinent surgical history.  Family History  Problem Relation Age of Onset   Depression Father    Alcohol abuse Father    Anxiety disorder Father    Drug abuse Father    Hyperlipidemia Father    Hypertension Father    Hypothyroidism Mother    Stroke Maternal Uncle    Cancer Maternal Grandmother    Diabetes Maternal Grandmother    Heart attack Maternal Grandfather    Heart attack Paternal Grandmother    Heart attack Paternal Grandfather     Social History   Socioeconomic History   Marital status: Single    Spouse name: Not on file   Number of  children: Not on file   Years of education: Not on file   Highest education level: Not on file  Occupational History   Not on file  Tobacco Use   Smoking status: Never   Smokeless tobacco: Never  Vaping Use   Vaping Use: Never used  Substance and Sexual Activity   Alcohol use: No   Drug use: No   Sexual activity: Yes    Partners: Male  Other Topics Concern   Not on file  Social History Narrative   Not on file   Social Determinants of Health   Financial Resource Strain: Not on file  Food Insecurity: Not on file  Transportation Needs: Not on file  Physical Activity: Not on file  Stress: Not on file  Social Connections: Not on file  Intimate Partner Violence: Not on file    Outpatient Medications Prior to Visit  Medication Sig Dispense Refill   amphetamine-dextroamphetamine (ADDERALL) 12.5 MG tablet Take 1 tablet by mouth 3 (three) times daily. 270 tablet 0   buPROPion (WELLBUTRIN XL) 300 MG 24 hr tablet Take 1 tablet (300 mg total) by mouth daily. 90 tablet 3   citalopram (CELEXA) 40 MG tablet Take 1 tablet (40 mg total) by mouth daily. 90 tablet 3   clobetasol ointment (TEMOVATE) 2.67 % Apply 1 application topically 2 (two) times daily. As needed for SEVERE psoriasis 30  g 1   fexofenadine (ALLEGRA) 180 MG tablet Take 180 mg by mouth daily.     ipratropium (ATROVENT) 0.06 % nasal spray Place 2 sprays into both nostrils 4 (four) times daily. As needed for runny nose / postnasal drip 45 mL 1   triamcinolone (KENALOG) 0.1 % Apply 1 application topically 2 (two) times daily. To affected area(s) as needed for MILD-MODERATE psoriasis 80 g 1   No facility-administered medications prior to visit.    Allergies  Allergen Reactions   Cetirizine Other (See Comments)   Amoxicillin Hives   Aspirin Other (See Comments) and Tinitus   Desogestrel-Ethinyl Estradiol Nausea Only and Other (See Comments)    Dizziness     Review of Systems  Constitutional:  Positive for fatigue.  Negative for appetite change, chills and fever.  HENT:  Positive for congestion, ear pain and rhinorrhea. Negative for hearing loss, sinus pressure, sinus pain, sore throat and trouble swallowing.   Eyes:  Positive for photophobia, pain, discharge, redness and itching.  Respiratory:  Positive for cough. Negative for shortness of breath.   Cardiovascular:  Negative for chest pain.  Gastrointestinal:  Negative for abdominal pain, constipation, diarrhea, nausea and vomiting.  Musculoskeletal:  Negative for myalgias.      Objective:    Physical Exam Constitutional:      General: She is not in acute distress.    Appearance: Normal appearance. She is obese. She is not ill-appearing, toxic-appearing or diaphoretic.  HENT:     Head: Normocephalic and atraumatic.     Right Ear: Ear canal and external ear normal. Tympanic membrane is erythematous and bulging.     Left Ear: Ear canal and external ear normal. Tympanic membrane is erythematous and bulging.     Nose: Nose normal.  Eyes:     General: Lids are normal.     Extraocular Movements: Extraocular movements intact.     Conjunctiva/sclera:     Right eye: Right conjunctiva is injected.     Left eye: Left conjunctiva is injected.  Cardiovascular:     Rate and Rhythm: Normal rate and regular rhythm.     Pulses: Normal pulses.  Pulmonary:     Effort: Pulmonary effort is normal. No respiratory distress.     Breath sounds: Normal breath sounds. No wheezing.  Musculoskeletal:        General: Normal range of motion.  Skin:    General: Skin is warm and dry.  Neurological:     Mental Status: She is alert and oriented to person, place, and time.  Psychiatric:        Mood and Affect: Mood normal.        Behavior: Behavior normal.        Thought Content: Thought content normal.        Judgment: Judgment normal.    BP (!) 152/72    Pulse 98    Ht 5\' 1"  (1.549 m)    Wt 171 lb 1 oz (77.6 kg)    SpO2 98%    BMI 32.32 kg/m  Wt Readings from Last  3 Encounters:  03/21/21 171 lb 1 oz (77.6 kg)  10/16/20 140 lb (63.5 kg)  04/17/20 155 lb 1.9 oz (70.4 kg)    Health Maintenance Due  Topic Date Due   COVID-19 Vaccine (1) Never done   HIV Screening  Never done   PAP SMEAR-Modifier  Never done    There are no preventive care reminders to display for this patient.  Assessment & Plan:  .Marland KitchenVerlyn was seen today for conjunctivitis.  Diagnoses and all orders for this visit:  Acute non-recurrent pansinusitis -     azithromycin (ZITHROMAX) 250 MG tablet; 2 tabs po x1 on Day 1, then 1 tab po daily on Days 2 - 5  Allergic conjunctivitis of both eyes -     methylPREDNISolone sodium succinate (SOLU-MEDROL) 125 mg/2 mL injection 125 mg  Elevated blood pressure reading without diagnosis of hypertension  Other orders -     Discontinue: azithromycin (ZITHROMAX) 250 MG tablet; 2 tabs po x1 on Day 1, then 1 tab po daily on Days 2 - 5   Pt obviously has a complex ocular history.  More itching than pain. No real life sensitivity. Does have new encounter with pets in home.  Gave solumedrol injection to treat inflammation and prescribed azithromycin to cover for bacterial sinusiits and/or conjunctivitis. Educated patient that her conjunctivitis is likely allergic and to continue OTC antihistamine drops and initiate zyrtec at bedtime.   I would strongly encourage patient to schedule follow up with eye doctor to stay on top of eye conditions.    Iran Planas, PA-C

## 2021-03-24 ENCOUNTER — Encounter: Payer: Self-pay | Admitting: Physician Assistant

## 2021-03-24 DIAGNOSIS — R030 Elevated blood-pressure reading, without diagnosis of hypertension: Secondary | ICD-10-CM | POA: Insufficient documentation

## 2021-04-21 ENCOUNTER — Other Ambulatory Visit: Payer: Self-pay | Admitting: Medical-Surgical

## 2021-04-21 MED ORDER — AMPHETAMINE-DEXTROAMPHETAMINE 12.5 MG PO TABS: 12.5000 mg | ORAL_TABLET | Freq: Three times a day (TID) | ORAL | 0 refills | Status: DC

## 2021-04-21 NOTE — Telephone Encounter (Signed)
Pt called for a refill on her Adderall RX.

## 2021-04-21 NOTE — Addendum Note (Signed)
Addended bySamuel Bouche on: 04/21/2021 04:05 PM   Modules accepted: Orders

## 2021-05-28 ENCOUNTER — Other Ambulatory Visit: Payer: Self-pay | Admitting: Sports Medicine

## 2021-05-28 MED ORDER — AMPHETAMINE-DEXTROAMPHETAMINE 12.5 MG PO TABS: 12.5000 mg | ORAL_TABLET | Freq: Three times a day (TID) | ORAL | 0 refills | Status: DC

## 2021-06-03 ENCOUNTER — Telehealth: Payer: Self-pay

## 2021-06-03 NOTE — Telephone Encounter (Signed)
Patient called needing PA for amphetamine-dextroamphetamine (ADDERALL) 12.5 MG tablet ? ?Thank you ?

## 2021-06-05 ENCOUNTER — Telehealth: Payer: Self-pay

## 2021-06-05 NOTE — Telephone Encounter (Signed)
Initiated Prior authorization [id number] 12.'5MG'$  tablets ?Via: Covermymeds ?Case/Key:[id number] ?Status: approved as of 06/05/21 ?Reason:Coverage Starts on: 06/05/2021 12:00 AM, Coverage Ends on: 06/06/2022 12:00 AM ?Notified Pt via: Mychart ?

## 2021-06-12 ENCOUNTER — Other Ambulatory Visit: Payer: Self-pay | Admitting: Medical-Surgical

## 2021-06-12 MED ORDER — AMPHETAMINE-DEXTROAMPHETAMINE 12.5 MG PO TABS: 12.5000 mg | ORAL_TABLET | Freq: Three times a day (TID) | ORAL | 0 refills | Status: DC

## 2021-06-12 NOTE — Telephone Encounter (Signed)
Patient came in and stated that she accidentally ran over medication listed below & needed a refill. Patient had a picture of it as well, if needed. ? ?amphetamine-dextroamphetamine (ADDERALL) 12.5 MG tablet ? ? ?[id number] [place]  [phone] ?Fax:  [phone] ?  ? ?

## 2021-06-16 ENCOUNTER — Telehealth: Payer: Self-pay

## 2021-06-16 NOTE — Telephone Encounter (Signed)
Attempted to call patient letting her know but rang once & VM box full. AM ?

## 2021-06-17 MED ORDER — AMPHETAMINE-DEXTROAMPHETAMINE 12.5 MG PO TABS: 12.5000 mg | ORAL_TABLET | Freq: Three times a day (TID) | ORAL | 0 refills | Status: DC

## 2021-06-17 NOTE — Addendum Note (Signed)
Addended bySamuel Bouche on: 06/17/2021 04:28 PM ? ? Modules accepted: Orders ? ?

## 2021-06-17 NOTE — Telephone Encounter (Signed)
Called pt and spoke to her in regards to her medications, pt  was informed that we sent a refill for her damaged medications, and  she is still responsible for paying out of pocket, Pt expressed understanding ?

## 2021-06-17 NOTE — Telephone Encounter (Signed)
Patient called stating that the pharmacy hasn't received anything stating that she can get her refill early. ?

## 2021-06-17 NOTE — Telephone Encounter (Signed)
Recent prescription to the pharmacy with a note to go ahead and fill early. ?

## 2021-06-18 NOTE — Telephone Encounter (Signed)
Called to let patient know RX sent to pharmacy with okay to fill early. No answer, vm full.  ?

## 2021-08-31 ENCOUNTER — Other Ambulatory Visit: Payer: Self-pay | Admitting: Medical-Surgical

## 2021-09-02 ENCOUNTER — Encounter: Payer: Self-pay | Admitting: Medical-Surgical

## 2021-09-02 ENCOUNTER — Other Ambulatory Visit (HOSPITAL_COMMUNITY)
Admission: RE | Admit: 2021-09-02 | Discharge: 2021-09-02 | Disposition: A | Discharge status: Home / Self Care | Payer: 59 | Source: Ambulatory Visit | Attending: Medical-Surgical | Admitting: Medical-Surgical

## 2021-09-02 ENCOUNTER — Other Ambulatory Visit: Payer: Self-pay | Admitting: Osteopathic Medicine

## 2021-09-02 ENCOUNTER — Ambulatory Visit (INDEPENDENT_AMBULATORY_CARE_PROVIDER_SITE_OTHER): Payer: 59 | Admitting: Medical-Surgical

## 2021-09-02 VITALS — BP 107/72 | HR 92 | Resp 20 | Ht 61.0 in | Wt 191.2 lb

## 2021-09-02 DIAGNOSIS — Z711 Person with feared health complaint in whom no diagnosis is made: Secondary | ICD-10-CM

## 2021-09-02 DIAGNOSIS — F900 Attention-deficit hyperactivity disorder, predominantly inattentive type: Secondary | ICD-10-CM

## 2021-09-02 DIAGNOSIS — F4323 Adjustment disorder with mixed anxiety and depressed mood: Secondary | ICD-10-CM | POA: Diagnosis not present

## 2021-09-02 DIAGNOSIS — Z30431 Encounter for routine checking of intrauterine contraceptive device: Secondary | ICD-10-CM

## 2021-09-02 DIAGNOSIS — Z124 Encounter for screening for malignant neoplasm of cervix: Secondary | ICD-10-CM

## 2021-09-02 LAB — WET PREP FOR TRICH, YEAST, CLUE
MICRO NUMBER:: 13577718
Specimen Quality: ADEQUATE

## 2021-09-02 MED ORDER — METRONIDAZOLE 500 MG PO TABS: 500.0000 mg | ORAL_TABLET | Freq: Two times a day (BID) | ORAL | 0 refills | Status: AC

## 2021-09-02 MED ORDER — AMPHETAMINE-DEXTROAMPHETAMINE 15 MG PO TABS: 15.0000 mg | ORAL_TABLET | Freq: Three times a day (TID) | ORAL | 0 refills | Status: DC

## 2021-09-03 LAB — C. TRACHOMATIS/N. GONORRHOEAE RNA
C. trachomatis RNA, TMA: NOT DETECTED
N. gonorrhoeae RNA, TMA: NOT DETECTED

## 2021-09-05 LAB — CYTOLOGY - PAP
Chlamydia: NEGATIVE
Comment: NEGATIVE
Comment: NEGATIVE
Comment: NEGATIVE
Comment: NORMAL
Diagnosis: UNDETERMINED — AB
High risk HPV: NEGATIVE
Neisseria Gonorrhea: NEGATIVE
Trichomonas: NEGATIVE

## 2021-09-12 ENCOUNTER — Encounter: Payer: Self-pay | Admitting: Medical-Surgical

## 2021-09-22 ENCOUNTER — Ambulatory Visit: Payer: 59

## 2021-09-24 ENCOUNTER — Ambulatory Visit (INDEPENDENT_AMBULATORY_CARE_PROVIDER_SITE_OTHER): Payer: 59

## 2021-09-24 DIAGNOSIS — Z30431 Encounter for routine checking of intrauterine contraceptive device: Secondary | ICD-10-CM

## 2021-09-24 DIAGNOSIS — R102 Pelvic and perineal pain: Secondary | ICD-10-CM | POA: Diagnosis not present

## 2021-10-01 ENCOUNTER — Other Ambulatory Visit: Payer: Self-pay | Admitting: Medical-Surgical

## 2021-10-01 MED ORDER — AMPHETAMINE-DEXTROAMPHETAMINE 15 MG PO TABS: 15.0000 mg | ORAL_TABLET | Freq: Three times a day (TID) | ORAL | 0 refills | Status: DC

## 2021-10-06 ENCOUNTER — Ambulatory Visit: Payer: 59 | Admitting: Medical-Surgical

## 2021-11-03 ENCOUNTER — Other Ambulatory Visit: Payer: Self-pay | Admitting: Medical-Surgical

## 2021-11-04 NOTE — Telephone Encounter (Signed)
Last RX was 10/04/21 for 30 day supply.  Last OV was 09/02/21.  Charyl Bigger, CMA

## 2021-11-05 MED ORDER — AMPHETAMINE-DEXTROAMPHETAMINE 15 MG PO TABS
15.0000 mg | ORAL_TABLET | Freq: Three times a day (TID) | ORAL | 0 refills | Status: DC
Start: 2021-11-05 — End: 2021-11-29

## 2021-11-07 ENCOUNTER — Ambulatory Visit: Payer: 59 | Admitting: Medical-Surgical

## 2021-11-14 ENCOUNTER — Encounter: Payer: Self-pay | Admitting: Medical-Surgical

## 2021-11-14 ENCOUNTER — Ambulatory Visit (INDEPENDENT_AMBULATORY_CARE_PROVIDER_SITE_OTHER): Payer: 59 | Admitting: Medical-Surgical

## 2021-11-14 VITALS — BP 132/89 | HR 86 | Resp 20 | Ht 61.0 in | Wt 183.4 lb

## 2021-11-14 DIAGNOSIS — Z808 Family history of malignant neoplasm of other organs or systems: Secondary | ICD-10-CM | POA: Insufficient documentation

## 2021-11-14 DIAGNOSIS — Z30433 Encounter for removal and reinsertion of intrauterine contraceptive device: Secondary | ICD-10-CM | POA: Diagnosis not present

## 2021-11-14 DIAGNOSIS — D225 Melanocytic nevi of trunk: Secondary | ICD-10-CM | POA: Insufficient documentation

## 2021-11-14 DIAGNOSIS — Q825 Congenital non-neoplastic nevus: Secondary | ICD-10-CM | POA: Insufficient documentation

## 2021-11-14 DIAGNOSIS — D2272 Melanocytic nevi of left lower limb, including hip: Secondary | ICD-10-CM | POA: Insufficient documentation

## 2021-11-14 NOTE — Progress Notes (Unsigned)
   Established Patient Office Visit  Subjective   Patient ID: Kathryn Brady, female   DOB: [birth year] Age: 26 y.o. MRN: [id number]   Chief Complaint  Patient presents with   Contraception    HPI    Objective:    Vitals:   11/14/21 1127  BP: 132/83  Pulse: 91  Resp: 20  Height: '5\' 1"'$  (1.549 m)  Weight: 183 lb 6.4 oz (83.2 kg)  SpO2: 97%  BMI (Calculated): 34.67    Physical Exam   No results found for this or any previous visit (from the past 24 hour(s)).   {Labs (Optional):23779}  The ASCVD Risk score (Arnett DK, et al., 2019) failed to calculate for the following reasons:   The 2019 ASCVD risk score is only valid for ages 71 to 70   Assessment & Plan:   No problem-specific Assessment & Plan notes found for this encounter.   No follow-ups on file.  ___________________________________________ Clearnce Sorrel, DNP, APRN, FNP-BC [facility] [place]

## 2021-11-15 NOTE — Patient Instructions (Signed)
IUD AFTER-CARE INSTRUCTIONS: READ THOROUGHLY  Your Kathryn Brady IUD is currently approved to remain in place for 5 years. At that time, if you wish to receive a new IUD, this can be placed when your current one is removed. If you wish to remove your IUD at any time, for any reason, this can be done easily by your doctor.   You should feel for the strings to your IUD routinely. If you cannot locate the strings, it is recommended you alert your doctor of this.   Be aware that in the first few weeks, your new IUD may cause some discomfort/cramping as it settles into place in your uterus. To ease discomfort, you may apply heating pad to abdomen and take Ibuprofen '800mg'$  by mouth every 6 hours as needed, but avoid using this dose continuously for more than 5 days. Walking helps as well. You can also expect some irregular bleeding but it should not be heavy for more than a few days.   If pain is severe or if severe bleeding occurs, or if foul-smelling discharge or fever develops, or if you have any other concerns - contact your doctor right away or go to the Emergency Room.  IUD's are a very reliable method of birth control, but no method is 100% effective. If you think you may be pregnant, see your doctor right away.   An IUD will not protect you from sexually transmitted infections such as HIV, gonorrhea, chlamydia, HPV and others.   It is recommended that you see your doctor as directed for routine well-woman care, which includes Pap testing and may include screening for infections.

## 2021-11-17 ENCOUNTER — Ambulatory Visit: Payer: 59 | Admitting: Medical-Surgical

## 2021-11-29 ENCOUNTER — Other Ambulatory Visit: Payer: Self-pay | Admitting: Medical-Surgical

## 2021-12-01 MED ORDER — AMPHETAMINE-DEXTROAMPHETAMINE 15 MG PO TABS
15.0000 mg | ORAL_TABLET | Freq: Three times a day (TID) | ORAL | 0 refills | Status: DC
Start: 2021-12-05 — End: 2021-12-25

## 2021-12-03 ENCOUNTER — Other Ambulatory Visit: Payer: Self-pay | Admitting: Osteopathic Medicine

## 2021-12-25 ENCOUNTER — Encounter: Payer: Self-pay | Admitting: Medical-Surgical

## 2021-12-25 ENCOUNTER — Ambulatory Visit (INDEPENDENT_AMBULATORY_CARE_PROVIDER_SITE_OTHER): Payer: 59 | Admitting: Medical-Surgical

## 2021-12-25 VITALS — BP 122/73 | HR 87 | Resp 20 | Ht 61.0 in | Wt 189.1 lb

## 2021-12-25 DIAGNOSIS — Z30431 Encounter for routine checking of intrauterine contraceptive device: Secondary | ICD-10-CM

## 2021-12-25 DIAGNOSIS — F900 Attention-deficit hyperactivity disorder, predominantly inattentive type: Secondary | ICD-10-CM

## 2021-12-25 DIAGNOSIS — F4323 Adjustment disorder with mixed anxiety and depressed mood: Secondary | ICD-10-CM | POA: Diagnosis not present

## 2021-12-25 DIAGNOSIS — B379 Candidiasis, unspecified: Secondary | ICD-10-CM | POA: Diagnosis not present

## 2021-12-25 DIAGNOSIS — N898 Other specified noninflammatory disorders of vagina: Secondary | ICD-10-CM

## 2021-12-25 LAB — POCT URINALYSIS DIP (CLINITEK)
Bilirubin, UA: NEGATIVE
Blood, UA: NEGATIVE
Glucose, UA: NEGATIVE mg/dL
Ketones, POC UA: NEGATIVE mg/dL
Leukocytes, UA: NEGATIVE
Nitrite, UA: NEGATIVE
POC PROTEIN,UA: NEGATIVE
Spec Grav, UA: >=1.03 — AB (ref 1.010–1.025)
Urobilinogen, UA: 0.2 E.U./dL
pH, UA: 6 (ref 5.0–8.0)

## 2021-12-25 LAB — WET PREP FOR TRICH, YEAST, CLUE
MICRO NUMBER:: 14073757
Specimen Quality: ADEQUATE

## 2021-12-25 MED ORDER — AMPHETAMINE-DEXTROAMPHETAMINE 15 MG PO TABS
15.0000 mg | ORAL_TABLET | Freq: Three times a day (TID) | ORAL | 0 refills | Status: DC
Start: 2022-01-04 — End: 2022-02-04

## 2021-12-25 NOTE — Progress Notes (Signed)
Established Patient Office Visit  Subjective   Patient ID: Kathryn Brady, female   DOB: January 10, 1996 Age: 26 y.o. MRN: [id number]   Chief Complaint  Patient presents with   Contraception    IUD string check   Urinary Tract Infection   Vaginitis    HPI Pleasant 26 year old female presenting today for the following:  IUD string check: Has been doing well with her IUD and has no complaints regarding menstruation, pelvic pain, or vaginal bleeding.  Has been sexually active with a female partner and has not had any complaints of dyspareunia or pain on his part.  Possible UTI: For the last several days, she has had some urinary symptoms and vaginal odor that were concerning.  She took Azo for couple of days however this helped and she stopped the medication.  She does still have a vaginal odor that she describes as chemical like.  No unusual discharge or vaginal itching.  Has been trying to increase her water consumption.  Denies fever, chills, nausea, vomiting, flank pain, and hematuria.  ADHD: Continues to take Adderall 15 mg 3 times daily.  Uses all 3 doses every day.  Feels the medication at this dose works well for her and she would like to continue the prescription.  Requesting refills.  Mood: Taking Celexa 40 mg and Wellbutrin 300 mg daily, tolerating both well without side effects.  After having her IUD placed and going to the beach, she had a significant manic episode where she was considering self-harm nearly every day.  This has since passed and she is more stable mentally and in a much better place.  Reports that her PHQ-9 that was completed today was reflecting that time of manic symptoms and if she redid the questionnaire to reflect the last few days, it would be nearly zeros on all of it.  Denies current SI/HI.   Objective:    Vitals:   12/25/21 1412  BP: 122/73  Pulse: 87  Resp: 20  Height: '5\' 1"'$  (1.549 m)  Weight: 189 lb 1.6 oz (85.8 kg)  SpO2: 97%  BMI (Calculated):  35.75   Physical Exam Vitals and nursing note reviewed.  Constitutional:      General: She is not in acute distress.    Appearance: Normal appearance. She is obese. She is not ill-appearing.  HENT:     Head: Normocephalic and atraumatic.  Cardiovascular:     Rate and Rhythm: Normal rate and regular rhythm.     Pulses: Normal pulses.     Heart sounds: Normal heart sounds.  Pulmonary:     Effort: Pulmonary effort is normal. No respiratory distress.     Breath sounds: Normal breath sounds. No wheezing, rhonchi or rales.  Skin:    General: Skin is warm and dry.  Neurological:     Mental Status: She is alert and oriented to person, place, and time.  Psychiatric:        Mood and Affect: Mood normal.        Behavior: Behavior normal.        Thought Content: Thought content normal.        Judgment: Judgment normal.    Results for orders placed or performed in visit on 12/25/21 (from the past 24 hour(s))  POCT URINALYSIS DIP (CLINITEK)     Status: Abnormal   Collection Time: 12/25/21  2:26 PM  Result Value Ref Range   Color, UA yellow yellow   Clarity, UA clear clear   Glucose, UA  negative negative mg/dL   Bilirubin, UA negative negative   Ketones, POC UA negative negative mg/dL   Spec Grav, UA >=1.030 (A) 1.010 - 1.025   Blood, UA negative negative   pH, UA 6.0 5.0 - 8.0   POC PROTEIN,UA negative negative, trace   Urobilinogen, UA 0.2 0.2 or 1.0 E.U./dL   Nitrite, UA Negative Negative   Leukocytes, UA Negative Negative      The ASCVD Risk score (Arnett DK, et al., 2019) failed to calculate for the following reasons:   The 2019 ASCVD risk score is only valid for ages 72 to 30   Assessment & Plan:   1. IUD check up IUD strings visualized, wrapped up and around cervix.  Good placement with no current concerns.  2. Vaginal odor POCT urinalysis negative with the exception of elevated specific gravity indicating possible dehydration.  Recommend increasing p.o. fluids.   Sending for culture since she has been using Azo.  Also sending wet prep for evaluation for BV/yeast. - POCT URINALYSIS DIP (CLINITEK) - Urine Culture - WET PREP FOR TRICH, YEAST, CLUE  3. Attention deficit hyperactivity disorder (ADHD), predominantly inattentive type Stable on current regimen.  Continue Adderall 15 mg 3 times daily.  4. Adjustment disorder with mixed anxiety and depressed mood Symptoms now stable after recent exacerbation.  Continue Celexa 40 mg and Wellbutrin 300 mg daily as prescribed.  Return in about 3 months (around 03/27/2022) for ADHD follow up.  ___________________________________________ Clearnce Sorrel, DNP, APRN, FNP-BC [facility] [place]

## 2021-12-28 LAB — URINE CULTURE
MICRO NUMBER:: 14073815
SPECIMEN QUALITY:: ADEQUATE

## 2021-12-29 MED ORDER — NITROFURANTOIN MONOHYD MACRO 100 MG PO CAPS: 100.0000 mg | ORAL_CAPSULE | Freq: Two times a day (BID) | ORAL | 0 refills | Status: DC

## 2021-12-29 NOTE — Addendum Note (Signed)
Addended bySamuel Bouche on: 12/29/2021 08:44 PM   Modules accepted: Orders

## 2022-01-27 ENCOUNTER — Other Ambulatory Visit: Payer: Self-pay | Admitting: Medical-Surgical

## 2022-01-27 NOTE — Telephone Encounter (Signed)
Patient is scheduled for 03/10/2022 '@3'$ :40. Tvt

## 2022-02-04 ENCOUNTER — Telehealth: Payer: Self-pay | Admitting: Medical-Surgical

## 2022-02-04 MED ORDER — AMPHETAMINE-DEXTROAMPHETAMINE 15 MG PO TABS: 15.0000 mg | ORAL_TABLET | Freq: Three times a day (TID) | ORAL | 0 refills | Status: DC

## 2022-02-04 NOTE — Telephone Encounter (Signed)
Patient called needs refills adderall would like it sent to [facility] on [address]

## 2022-02-12 ENCOUNTER — Other Ambulatory Visit: Payer: Self-pay | Admitting: Medical-Surgical

## 2022-03-07 ENCOUNTER — Encounter (HOSPITAL_BASED_OUTPATIENT_CLINIC_OR_DEPARTMENT_OTHER): Payer: Self-pay | Admitting: Emergency Medicine

## 2022-03-07 ENCOUNTER — Emergency Department (HOSPITAL_BASED_OUTPATIENT_CLINIC_OR_DEPARTMENT_OTHER)
Admission: EM | Admit: 2022-03-07 | Discharge: 2022-03-08 | Disposition: A | Discharge status: Home / Self Care | Payer: 59 | Attending: Emergency Medicine | Admitting: Emergency Medicine

## 2022-03-07 DIAGNOSIS — K047 Periapical abscess without sinus: Secondary | ICD-10-CM | POA: Insufficient documentation

## 2022-03-07 DIAGNOSIS — J029 Acute pharyngitis, unspecified: Secondary | ICD-10-CM | POA: Diagnosis not present

## 2022-03-07 DIAGNOSIS — U071 COVID-19: Secondary | ICD-10-CM | POA: Insufficient documentation

## 2022-03-07 LAB — RESP PANEL BY RT-PCR (RSV, FLU A&B, COVID)  RVPGX2
Influenza A by PCR: NEGATIVE
Influenza B by PCR: NEGATIVE
Resp Syncytial Virus by PCR: NEGATIVE
SARS Coronavirus 2 by RT PCR: POSITIVE — AB

## 2022-03-07 MED ORDER — AZITHROMYCIN 250 MG PO TABS
500.0000 mg | ORAL_TABLET | Freq: Once | ORAL | Status: AC
  Administered 2022-03-07: 500 mg via ORAL
  Filled 2022-03-07: qty 2

## 2022-03-07 MED ORDER — AZITHROMYCIN 500 MG PO TABS: 500.0000 mg | ORAL_TABLET | Freq: Every day | ORAL | 0 refills | Status: AC

## 2022-03-07 NOTE — ED Triage Notes (Signed)
Pt here from home with c/o fever congestion and slight cough

## 2022-03-07 NOTE — ED Provider Notes (Signed)
[facility]  Provider Note  CSN: [id number] Arrival date & time: 03/07/22 1739  History Chief Complaint  Patient presents with   Sore Throat    Embden is a 26 y.o. female with no significant PMH reports 5 days of URI symptoms with cough, sore throat, headache and fevers. Some improvement with OTC meds. Also has a R upper molar with cavity that has been painful the last 2 days with pain into her R cheek.   Home Medications Prior to Admission medications   Medication Sig Start Date End Date Taking? Authorizing Provider  azithromycin (ZITHROMAX) 500 MG tablet Take 1 tablet (500 mg total) by mouth daily for 3 days. Take first 2 tablets together, then 1 every day until finished. 03/07/22 03/10/22 Yes Truddie Hidden, MD  amphetamine-dextroamphetamine (ADDERALL) 15 MG tablet Take 1 tablet by mouth 3 (three) times daily. 02/04/22   Luetta Nutting, DO  buPROPion (WELLBUTRIN XL) 300 MG 24 hr tablet TAKE 1 TABLET BY MOUTH EVERY DAY 01/27/22   Samuel Bouche, NP  citalopram (CELEXA) 40 MG tablet TAKE 1 TABLET BY MOUTH EVERY DAY 01/27/22   Samuel Bouche, NP  clobetasol ointment (TEMOVATE) 5.05 % Apply 1 application topically 2 (two) times daily. As needed for SEVERE psoriasis 04/17/20   Emeterio Reeve, DO  fexofenadine (ALLEGRA) 180 MG tablet Take 180 mg by mouth daily.    [provider]  nitrofurantoin, macrocrystal-monohydrate, (MACROBID) 100 MG capsule Take 1 capsule (100 mg total) by mouth 2 (two) times daily. 12/29/21   Samuel Bouche, NP  triamcinolone (KENALOG) 0.1 % Apply 1 application topically 2 (two) times daily. To affected area(s) as needed for MILD-MODERATE psoriasis 04/17/20   Emeterio Reeve, DO     Allergies    Cetirizine, Amoxicillin, Aspirin, and Desogestrel-ethinyl estradiol   Review of Systems   Review of Systems Please see HPI for pertinent positives and negatives  Physical Exam BP (!) 154/86 (BP Location: Right Arm)    Pulse 97   Temp 98.3 F (36.8 C) (Oral)   Resp 18   SpO2 99%   Physical Exam Vitals and nursing note reviewed.  Constitutional:      Appearance: Normal appearance.  HENT:     Head: Normocephalic and atraumatic.     Nose: Nose normal.     Mouth/Throat:     Mouth: Mucous membranes are moist.     Comments: Erythema, induration and tenderness surrounding the R upper molar with large dental caries Eyes:     Extraocular Movements: Extraocular movements intact.     Conjunctiva/sclera: Conjunctivae normal.  Cardiovascular:     Rate and Rhythm: Normal rate.  Pulmonary:     Effort: Pulmonary effort is normal.     Breath sounds: Normal breath sounds.  Abdominal:     General: Abdomen is flat.     Palpations: Abdomen is soft.     Tenderness: There is no abdominal tenderness.  Musculoskeletal:        General: No swelling. Normal range of motion.     Cervical back: Neck supple.  Skin:    General: Skin is warm and dry.  Neurological:     General: No focal deficit present.     Mental Status: She is alert.  Psychiatric:        Mood and Affect: Mood normal.     ED Results / Procedures / Treatments   EKG None  Procedures Procedures  Medications Ordered in the ED Medications  azithromycin (ZITHROMAX) tablet  500 mg (has no administration in time range)    Initial Impression and Plan  Patient here with several days of URI as well as a toothache. Covid is positive, but also has signs of dental infection. She has a PCN allergy, will give Zithromax, recommend dental follow up. Supportive care and isolation for Covid infection. Not a candidate for Paxlovid given duration of symptoms and minimal risk factors.   ED Course       MDM Rules/Calculators/A&P Medical Decision Making Problems Addressed: COVID-19: acute illness or injury Dental infection: acute illness or injury  Amount and/or Complexity of Data Reviewed Labs: ordered. Decision-making details documented in ED  Course.  Risk OTC drugs. Prescription drug management.    Final Clinical Impression(s) / ED Diagnoses Final diagnoses:  COVID-19  Dental infection    Rx / DC Orders ED Discharge Orders          Ordered    azithromycin (ZITHROMAX) 500 MG tablet  Daily        03/07/22 2348             Truddie Hidden, MD 03/07/22 2348

## 2022-03-10 ENCOUNTER — Encounter: Payer: Self-pay | Admitting: Medical-Surgical

## 2022-03-10 ENCOUNTER — Telehealth: Payer: Self-pay | Admitting: Medical-Surgical

## 2022-03-10 ENCOUNTER — Telehealth (INDEPENDENT_AMBULATORY_CARE_PROVIDER_SITE_OTHER): Payer: Self-pay | Admitting: Medical-Surgical

## 2022-03-10 DIAGNOSIS — F900 Attention-deficit hyperactivity disorder, predominantly inattentive type: Secondary | ICD-10-CM

## 2022-03-10 NOTE — Progress Notes (Signed)
Virtual Visit via Video Note  I connected with [place] on 03/10/22 at  3:40 PM EST by a video enabled telemedicine application and verified that I am speaking with the correct person using two identifiers.   I discussed the limitations of evaluation and management by telemedicine and the availability of in person appointments. The patient expressed understanding and agreed to proceed.  Patient location: home Provider locations: office  Subjective:    CC: ADHD follow-up  HPI: Pleasant 27 year old female presenting via [place] video visit to follow-up on ADHD.  Unfortunately, she tested positive for COVID 2 days ago.  Her symptoms started on Christmas Day and she reports that she is feeling much better now.  She is taking Adderall 15 mg instant release 3 times daily as needed.  Most days she does take 3 doses however there are some where she skips a dose due to waking late.  Feels the medication is working very well for her and keeping her focused.  No changes in sleep pattern, appetite, or weight.  Denies side effects.   Past medical history, Surgical history, Family history not pertinant except as noted below, Social history, Allergies, and medications have been entered into the medical record, reviewed, and corrections made.   Review of Systems: See HPI for pertinent positives and negatives.   Objective:    General: Speaking clearly in complete sentences without any shortness of breath.  Alert and oriented x3.  Normal judgment. No apparent acute distress.  Impression and Recommendations:    1. Attention deficit hyperactivity disorder (ADHD), predominantly inattentive type Symptoms stable on current regimen.  Continue Adderall 15 mg 3 times daily as needed.  I discussed the assessment and treatment plan with the patient. The patient was provided an opportunity to ask questions and all were answered. The patient agreed with the plan and demonstrated an understanding of the  instructions.   The patient was advised to call back or seek an in-person evaluation if the symptoms worsen or if the condition fails to improve as anticipated.  20 minutes of non-face-to-face time was provided during this encounter.  Return in about 3 months (around 06/09/2022) for ADHD follow up.  Clearnce Sorrel, DNP, APRN, FNP-BC [facility]

## 2022-03-10 NOTE — Telephone Encounter (Signed)
Pt called tested postive for covid. Do you want to cancel the appt or no show.

## 2022-03-12 ENCOUNTER — Other Ambulatory Visit: Payer: Self-pay | Admitting: Family Medicine

## 2022-03-13 MED ORDER — AMPHETAMINE-DEXTROAMPHETAMINE 15 MG PO TABS: 15.0000 mg | ORAL_TABLET | Freq: Three times a day (TID) | ORAL | 0 refills | Status: DC

## 2022-03-30 ENCOUNTER — Ambulatory Visit: Payer: 59 | Admitting: Medical-Surgical

## 2022-04-14 ENCOUNTER — Other Ambulatory Visit: Payer: Self-pay | Admitting: Medical-Surgical

## 2022-04-14 MED ORDER — AMPHETAMINE-DEXTROAMPHETAMINE 15 MG PO TABS: 15.0000 mg | ORAL_TABLET | Freq: Three times a day (TID) | ORAL | 0 refills | Status: DC

## 2022-05-06 ENCOUNTER — Other Ambulatory Visit: Payer: Self-pay | Admitting: Medical-Surgical

## 2022-05-21 ENCOUNTER — Other Ambulatory Visit: Payer: Self-pay | Admitting: Neurology

## 2022-05-21 MED ORDER — AMPHETAMINE-DEXTROAMPHETAMINE 15 MG PO TABS: 15.0000 mg | ORAL_TABLET | Freq: Three times a day (TID) | ORAL | 0 refills | Status: DC

## 2022-05-21 NOTE — Telephone Encounter (Signed)
Patient due for follow up for ADHD in about 3 weeks but no appointment scheduled. 30 day refill of Adderall sent. Please contact patient to schedule follow up.

## 2022-05-21 NOTE — Telephone Encounter (Signed)
Patient last seen in January. Adderall refill last sent for one month on 04/14/2022. Pended.

## 2022-05-21 NOTE — Telephone Encounter (Signed)
Pt scheduled for 06/02/2022 @ 1:20 tvt

## 2022-06-02 ENCOUNTER — Encounter: Payer: Self-pay | Admitting: Medical-Surgical

## 2022-06-02 ENCOUNTER — Telehealth (INDEPENDENT_AMBULATORY_CARE_PROVIDER_SITE_OTHER): Payer: Self-pay | Admitting: Medical-Surgical

## 2022-06-02 DIAGNOSIS — F4323 Adjustment disorder with mixed anxiety and depressed mood: Secondary | ICD-10-CM

## 2022-06-02 DIAGNOSIS — M26609 Unspecified temporomandibular joint disorder, unspecified side: Secondary | ICD-10-CM

## 2022-06-02 DIAGNOSIS — F900 Attention-deficit hyperactivity disorder, predominantly inattentive type: Secondary | ICD-10-CM

## 2022-06-02 MED ORDER — AMPHETAMINE-DEXTROAMPHETAMINE 15 MG PO TABS: 15.0000 mg | ORAL_TABLET | Freq: Three times a day (TID) | ORAL | 0 refills | Status: AC

## 2022-06-02 NOTE — Assessment & Plan Note (Signed)
Approximately 2 weeks of left-sided jaw pain that at 1 point spread to her left ear.  This lasted for 2 days before it improved.  Unfortunately the left jaw is still very tender and uncomfortable.  Notes that she has a history of frequent popping of the jaw and her mother mentioned TMJ several years ago.  Denies grinding her teeth at night but does note that she clenches her jaw when she gets anxious or upset.  Tried taking ibuprofen a couple of times but it has not been very helpful.  Discussed the etiology of TMJ dysfunction.  Would like for her to start Aleve 1 tablet twice daily for the next 1 to 2 weeks.  Avoid foods that require excessive chewing as well as gum chewing.  Advised that she is welcome to use massage, heat, ice, or Tylenol as needed for discomfort.  Information regarding TMJ dysfunction sent via MyChart with her AVS.

## 2022-06-02 NOTE — Progress Notes (Signed)
        Established patient visit  Brief history, exam, impression, and plan:  TMJ dysfunction Approximately 2 weeks of left-sided jaw pain that at 1 point spread to her left ear.  This lasted for 2 days before it improved.  Unfortunately the left jaw is still very tender and uncomfortable.  Notes that she has a history of frequent popping of the jaw and her mother mentioned TMJ several years ago.  Denies grinding her teeth at night but does note that she clenches her jaw when she gets anxious or upset.  Tried taking ibuprofen a couple of times but it has not been very helpful.  Discussed the etiology of TMJ dysfunction.  Would like for her to start Aleve 1 tablet twice daily for the next 1 to 2 weeks.  Avoid foods that require excessive chewing as well as gum chewing.  Advised that she is welcome to use massage, heat, ice, or Tylenol as needed for discomfort.  Information regarding TMJ dysfunction sent via MyChart with her AVS.  Adjustment disorder with mixed anxiety and depressed mood Doing well on Celexa 40 mg daily and Wellbutrin 300 mg daily.  She has had some issues with increased manic symptoms lately but this seems to be more situational.  Denies SI/HI.  When her symptoms are exacerbated, she will tend to skip doses to avoid excess stimulation.  Attention deficit hyperactivity disorder (ADHD), predominantly inattentive type Stable and well-controlled on current regimen.  Tolerating Adderall 15 mg instant release 3 times daily as needed so no changes today.  No changes in weight, appetite, or sleeping patterns.  Okay to skip doses if needed to better manage many symptoms.  Procedures performed this visit: None.  Return in about 3 months (around 09/02/2022) for ADHD follow up.  __________________________________ Clearnce Sorrel, DNP, APRN, FNP-BC [facility] [place]

## 2022-06-02 NOTE — Assessment & Plan Note (Addendum)
Stable and well-controlled on current regimen.  Tolerating Adderall 15 mg instant release 3 times daily as needed so no changes today.  No changes in weight, appetite, or sleeping patterns.  Okay to skip doses if needed to better manage many symptoms.

## 2022-06-02 NOTE — Assessment & Plan Note (Signed)
Doing well on Celexa 40 mg daily and Wellbutrin 300 mg daily.  She has had some issues with increased manic symptoms lately but this seems to be more situational.  Denies SI/HI.  When her symptoms are exacerbated, she will tend to skip doses to avoid excess stimulation.

## 2022-06-02 NOTE — Patient Instructions (Signed)
Temporomandibular Joint Syndrome  Temporomandibular joint syndrome (TMJ syndrome) is a condition that causes pain in the temporomandibular joints. These joints are located near your ears and allow your jaw to open and close. For people with TMJ syndrome, chewing, biting, or other movements of the jaw can be difficult or painful. TMJ syndrome is often mild and goes away within a few weeks. However, sometimes the condition becomes a long-term (chronic) problem. What are the causes? This condition may be caused by: Grinding your teeth or clenching your jaw. Some people do this when they are stressed. Arthritis. An injury to the jaw. A head or neck injury. Teeth or dentures that are not aligned well. In some cases, the cause of TMJ syndrome may not be known. What are the signs or symptoms? The most common symptom of this condition is aching pain on the side of the head in the area of the TMJ. Other symptoms may include: Pain when moving your jaw, such as when chewing or biting. Not being able to open your jaw all the way. Making a clicking sound when you open your mouth. Headache. Earache. Neck or shoulder pain. How is this diagnosed? This condition may be diagnosed based on: Your symptoms and medical history. A physical exam. Your health care provider may check the range of motion of your jaw. Imaging tests, such as X-rays or an MRI. You may also need to see your dentist, who will check if your teeth and jaw are lined up correctly. How is this treated? TMJ syndrome often goes away on its own. If treatment is needed, it may include: Eating soft foods and applying ice or heat. Medicines to relieve pain or inflammation. Medicines or massage to relax the muscles. A splint, bite plate, or mouthpiece to prevent teeth grinding or jaw clenching. Relaxation techniques or counseling to help reduce stress. A therapy for pain in which an electrical current is applied to the nerves through the skin  (transcutaneous electrical nerve stimulation). Acupuncture. This may help to relieve pain. Jaw surgery. This is rarely needed. Follow these instructions at home:  Eating and drinking Eat a soft diet if you are having trouble chewing. Avoid foods that require a lot of chewing. Do not chew gum. General instructions Take over-the-counter and prescription medicines only as told by your health care provider. If directed, put ice on the painful area. To do this: Put ice in a plastic bag. Place a towel between your skin and the bag. Leave the ice on for 20 minutes, 2-3 times a day. Remove the ice if your skin turns bright red. This is very important. If you cannot feel pain, heat, or cold, you have a greater risk of damage to the area. Apply a warm, wet cloth (warm compress) to the painful area as told. Massage your jaw area and do any jaw stretching exercises as told by your health care provider. If you were given a splint, bite plate, or mouthpiece, wear it as told by your health care provider. Keep all follow-up visits. This is important. Where to find more information National Institute of Dental and Craniofacial Research: www.nidcr.nih.gov Contact a health care provider if: You have trouble eating. You have new or worsening symptoms. Get help right away if: Your jaw locks. Summary Temporomandibular joint syndrome (TMJ syndrome) is a condition that causes pain in the temporomandibular joints. These joints are located near your ears and allow your jaw to open and close. TMJ syndrome is often mild and goes away within   a few weeks. However, sometimes the condition becomes a long-term (chronic) problem. Symptoms include an aching pain on the side of the head in the area of the TMJ, pain when chewing or biting, and being unable to open your jaw all the way. You may also make a clicking sound when you open your mouth. TMJ syndrome often goes away on its own. If treatment is needed, it may  include medicines to relieve pain, reduce inflammation, or relax the muscles. A splint, bite plate, or mouthpiece may also be used to prevent teeth grinding or jaw clenching. This information is not intended to replace advice given to you by your health care provider. Make sure you discuss any questions you have with your health care provider. Document Revised: 10/06/2020 Document Reviewed: 10/06/2020 Elsevier Patient Education  2023 [employer]  

## 2022-07-20 ENCOUNTER — Other Ambulatory Visit: Payer: Self-pay | Admitting: Medical-Surgical

## 2022-07-20 DIAGNOSIS — F900 Attention-deficit hyperactivity disorder, predominantly inattentive type: Secondary | ICD-10-CM

## 2022-07-21 NOTE — Telephone Encounter (Signed)
According to epic, she should have 2 prescriptions available that have not been filled/picked up yet.  Please have her contact the pharmacy to get her prescription ready to pick up.

## 2022-07-27 ENCOUNTER — Other Ambulatory Visit: Payer: Self-pay | Admitting: Medical-Surgical

## 2022-07-27 DIAGNOSIS — F900 Attention-deficit hyperactivity disorder, predominantly inattentive type: Secondary | ICD-10-CM

## 2023-01-04 ENCOUNTER — Other Ambulatory Visit: Payer: Self-pay | Admitting: Medical-Surgical

## 2023-01-04 DIAGNOSIS — F900 Attention-deficit hyperactivity disorder, predominantly inattentive type: Secondary | ICD-10-CM

## 2023-03-23 ENCOUNTER — Ambulatory Visit (HOSPITAL_COMMUNITY): Payer: Self-pay | Admitting: Licensed Clinical Social Worker

## 2023-11-09 ENCOUNTER — Encounter: Payer: Self-pay | Admitting: Sports Medicine
